# Patient Record
Sex: Male | Born: 1985 | Race: White | Hispanic: No | Marital: Married | Smoking: Former smoker
Health system: Southern US, Community
[De-identification: ages and names within clinical notes are randomized; demographics above are authoritative.]

## PROBLEM LIST (undated history)

## (undated) DIAGNOSIS — F329 Major depressive disorder, single episode, unspecified: Secondary | ICD-10-CM

## (undated) DIAGNOSIS — F419 Anxiety disorder, unspecified: Secondary | ICD-10-CM

## (undated) DIAGNOSIS — G4730 Sleep apnea, unspecified: Secondary | ICD-10-CM

## (undated) DIAGNOSIS — F32A Depression, unspecified: Secondary | ICD-10-CM

## (undated) HISTORY — PX: NOSE SURGERY: SHX723

## (undated) HISTORY — PX: HIP SURGERY: SHX245

---

## 2011-08-20 ENCOUNTER — Encounter (HOSPITAL_COMMUNITY): Payer: Self-pay | Admitting: Emergency Medicine

## 2011-08-20 ENCOUNTER — Emergency Department (HOSPITAL_COMMUNITY)
Admission: EM | Admit: 2011-08-20 | Discharge: 2011-08-20 | Disposition: A | Discharge status: Home / Self Care | Attending: Emergency Medicine | Admitting: Emergency Medicine

## 2011-08-20 DIAGNOSIS — L259 Unspecified contact dermatitis, unspecified cause: Secondary | ICD-10-CM | POA: Insufficient documentation

## 2011-08-20 MED ORDER — PREDNISONE 20 MG PO TABS
60.0000 mg | ORAL_TABLET | Freq: Once | ORAL | Status: AC
  Administered 2011-08-20: 60 mg via ORAL
  Filled 2011-08-20: qty 3

## 2011-08-20 MED ORDER — PREDNISONE 20 MG PO TABS: ORAL_TABLET | ORAL | Status: AC

## 2011-08-20 NOTE — Discharge Instructions (Signed)

## 2011-08-20 NOTE — ED Notes (Signed)
Pt. States he was clearing poisonous plants out while doing yard work. Pt states he has had redness, burning and itching to bilateral hands since then. Pt states Benadryl not effective.

## 2011-08-20 NOTE — ED Notes (Signed)
Pt alert, nad, c/o rash "poison" to hands, onset a few days ago, states recently treated for poison ivy, resp even unlabored, skin pwd

## 2011-08-20 NOTE — ED Provider Notes (Signed)
History     CSN: [id number]  Arrival date & time 08/20/11  4098   First MD Initiated Contact with Patient 08/20/11 [phone]      Chief Complaint  Patient presents with  . Rash    (Consider location/radiation/quality/duration/timing/severity/associated sxs/prior treatment) HPI History provided by patient. Rash to hands and arms. Patient has been working outside and clearing bushes and shrubs to build a fence. He believes he was exposed to poison ivy. He has rash and itching to his hands and forearms. He has history of same. Over-the-counter medications not helping. He also has some rash and itching to his nose. No facial swelling or difficulty breathing. No lip tongue or oral swelling. Moderate severity. Denies any tick exposure. Moderate in severity. No alleviating factors. History reviewed. No pertinent past medical history.  History reviewed. No pertinent past surgical history.  No family history on file.  History  Substance Use Topics  . Smoking status: Never Smoker   . Smokeless tobacco: Not on file  . Alcohol Use: No      Review of Systems  Constitutional: Negative for fever and chills.  HENT: Negative for neck pain and neck stiffness.   Eyes: Negative for pain.  Respiratory: Negative for shortness of breath.   Cardiovascular: Negative for chest pain.  Gastrointestinal: Negative for abdominal pain.  Genitourinary: Negative for dysuria.  Musculoskeletal: Negative for back pain.  Skin: Positive for rash.  Neurological: Negative for headaches.  All other systems reviewed and are negative.    Allergies  Ceclor and Nuvigil  Home Medications   Current Outpatient Rx  Name Route Sig Dispense Refill  . FEXOFENADINE HCL 180 MG PO TABS Oral Take 180 mg by mouth daily.      BP 126/92  Pulse 62  Temp(Src) 98.2 F (36.8 C) (Oral)  Resp 16  Wt 205 lb (92.987 kg)  SpO2 100%  Physical Exam  Constitutional: He is oriented to person, place, and time. He appears  well-developed and well-nourished.  HENT:  Head: Normocephalic and atraumatic.  Eyes: Conjunctivae and EOM are normal. Pupils are equal, round, and reactive to light.  Neck: Trachea normal. Neck supple. No thyromegaly present.       No stridor  Cardiovascular: Normal rate, regular rhythm, S1 normal, S2 normal and normal pulses.     No systolic murmur is present   No diastolic murmur is present  Pulses:      Radial pulses are 2+ on the right side, and 2+ on the left side.  Pulmonary/Chest: Effort normal and breath sounds normal. He has no wheezes. He has no rhonchi. He has no rales. He exhibits no tenderness.  Abdominal: Soft. Normal appearance and bowel sounds are normal. There is no tenderness. There is no CVA tenderness and negative Murphy's sign.  Musculoskeletal:       BLE:s Calves nontender, no cords or erythema, negative Homans sign  Neurological: He is alert and oriented to person, place, and time. He has normal strength. No cranial nerve deficit or sensory deficit. GCS eye subscore is 4. GCS verbal subscore is 5. GCS motor subscore is 6.  Skin: Skin is warm and dry. He is not diaphoretic.       Erythematous and urticarial rash with excoriations to hands and forearms and somewhat over the bridge of nose.  Psychiatric: His speech is normal.       Cooperative and appropriate    ED Course  Procedures (including critical care time)  Prednisone initiated  MDM  Presentation consistent with contact dermatitis with history of same and recently exposed to plants. Plan to taper prednisone for facial involvement.  palmar rash does not suggest Rocky mouth spotted fever - he is afebrile and denies tick bites. He agrees to strict return precautions for any fever or worsening condition.        Sunnie Nielsen, MD 08/20/11 [phone]

## 2012-02-13 ENCOUNTER — Emergency Department (HOSPITAL_COMMUNITY)
Admission: EM | Admit: 2012-02-13 | Discharge: 2012-02-13 | Disposition: A | Discharge status: Home / Self Care | Attending: Emergency Medicine | Admitting: Emergency Medicine

## 2012-02-13 DIAGNOSIS — R51 Headache: Secondary | ICD-10-CM

## 2012-02-13 DIAGNOSIS — J329 Chronic sinusitis, unspecified: Secondary | ICD-10-CM | POA: Insufficient documentation

## 2012-02-13 DIAGNOSIS — J029 Acute pharyngitis, unspecified: Secondary | ICD-10-CM | POA: Insufficient documentation

## 2012-02-13 DIAGNOSIS — Z79899 Other long term (current) drug therapy: Secondary | ICD-10-CM | POA: Insufficient documentation

## 2012-02-13 DIAGNOSIS — J3489 Other specified disorders of nose and nasal sinuses: Secondary | ICD-10-CM | POA: Insufficient documentation

## 2012-02-13 MED ORDER — SULFAMETHOXAZOLE-TMP DS 800-160 MG PO TABS
1.0000 | ORAL_TABLET | Freq: Once | ORAL | Status: AC
  Administered 2012-02-13: 1 via ORAL
  Filled 2012-02-13: qty 1

## 2012-02-13 MED ORDER — SULFAMETHOXAZOLE-TRIMETHOPRIM 800-160 MG PO TABS: 1.0000 | ORAL_TABLET | Freq: Two times a day (BID) | ORAL | Status: DC

## 2012-02-13 MED ORDER — OXYCODONE-ACETAMINOPHEN 5-325 MG PO TABS
1.0000 | ORAL_TABLET | Freq: Once | ORAL | Status: AC
  Administered 2012-02-13: 1 via ORAL
  Filled 2012-02-13: qty 1

## 2012-02-13 MED ORDER — OXYCODONE-ACETAMINOPHEN 5-325 MG PO TABS: 2.0000 | ORAL_TABLET | ORAL | Status: DC | PRN

## 2012-02-13 NOTE — ED Provider Notes (Signed)
History     CSN: [id number]  Arrival date & time 02/13/12  0910   First MD Initiated Contact with Patient 02/13/12 0932      Chief Complaint  Patient presents with  . Headache  . Sore Throat    (Consider location/radiation/quality/duration/timing/severity/associated sxs/prior treatment) Patient is a 26 y.o. male presenting with headaches and pharyngitis. The history is provided by the patient.  Headache  Pertinent negatives include no fever, no nausea and no vomiting.  Sore Throat Associated symptoms include headaches.   26 year old, male, presents emergency department complaining of headache, over his frontal and maxillary sinuses.  Congestion, and sore throat, for a few days.  He denies cough, nausea, or vomiting, or rash.  He did have a fever.  A few days ago, but not now.  He denies pain anywhere else.    No past medical history on file.  No past surgical history on file.  No family history on file.  History  Substance Use Topics  . Smoking status: Never Smoker   . Smokeless tobacco: Not on file  . Alcohol Use: No      Review of Systems  Constitutional: Negative for fever and chills.  HENT: Positive for congestion and sore throat.   Respiratory: Negative for cough.   Gastrointestinal: Negative for nausea and vomiting.  Skin: Negative for rash.  Neurological: Positive for headaches.  All other systems reviewed and are negative.    Allergies  Ceclor and Nuvigil  Home Medications   Current Outpatient Rx  Name  Route  Sig  Dispense  Refill  . ACETAMINOPHEN 500 MG PO TABS   Oral   Take 1,000 mg by mouth every 6 (six) hours as needed. Headache         . NYQUIL MULTI-SYMPTOM PO   Oral   Take by mouth.         . SERTRALINE HCL 100 MG PO TABS   Oral   Take 150 mg by mouth daily.         . OXYCODONE-ACETAMINOPHEN 5-325 MG PO TABS   Oral   Take 2 tablets by mouth every 4 (four) hours as needed for pain.   6 tablet   0   .  SULFAMETHOXAZOLE-TRIMETHOPRIM 800-160 MG PO TABS   Oral   Take 1 tablet by mouth 2 (two) times daily.   20 tablet   0     BP 123/80  Pulse 99  Temp 98.2 F (36.8 C) (Oral)  Resp 20  SpO2 96%  Physical Exam  Nursing note and vitals reviewed. Constitutional: He is oriented to person, place, and time. He appears well-developed and well-nourished. No distress.  HENT:  Head: Normocephalic and atraumatic.  Mouth/Throat: Oropharynx is clear and moist. No oropharyngeal exudate.  Eyes: Conjunctivae normal are normal.  Neck: Normal range of motion. Neck supple.  Pulmonary/Chest: Effort normal.  Abdominal: He exhibits no distension.  Musculoskeletal: Normal range of motion.  Lymphadenopathy:    He has no cervical adenopathy.  Neurological: He is alert and oriented to person, place, and time.  Skin: Skin is warm and dry.  Psychiatric: He has a normal mood and affect. Thought content normal.    ED Course  Procedures (including critical care time)  Labs Reviewed - No data to display No results found.   1. Sinusitis   2. Headache       MDM  Sinusitis         Cheri Guppy, MD 02/13/12 [phone]

## 2012-02-13 NOTE — ED Notes (Signed)
Pt states that for the last 2 days he has had a sore throat and "a huge headache".  Denies NVD.  Pain 8/10.

## 2012-03-01 ENCOUNTER — Encounter (HOSPITAL_COMMUNITY): Payer: Self-pay | Admitting: Emergency Medicine

## 2012-03-01 ENCOUNTER — Emergency Department (HOSPITAL_COMMUNITY)
Admission: EM | Admit: 2012-03-01 | Discharge: 2012-03-01 | Disposition: A | Discharge status: Home / Self Care | Attending: Emergency Medicine | Admitting: Emergency Medicine

## 2012-03-01 ENCOUNTER — Emergency Department (HOSPITAL_COMMUNITY)

## 2012-03-01 DIAGNOSIS — J029 Acute pharyngitis, unspecified: Secondary | ICD-10-CM | POA: Insufficient documentation

## 2012-03-01 DIAGNOSIS — R112 Nausea with vomiting, unspecified: Secondary | ICD-10-CM | POA: Insufficient documentation

## 2012-03-01 DIAGNOSIS — R059 Cough, unspecified: Secondary | ICD-10-CM | POA: Insufficient documentation

## 2012-03-01 DIAGNOSIS — F329 Major depressive disorder, single episode, unspecified: Secondary | ICD-10-CM | POA: Insufficient documentation

## 2012-03-01 DIAGNOSIS — F411 Generalized anxiety disorder: Secondary | ICD-10-CM | POA: Insufficient documentation

## 2012-03-01 DIAGNOSIS — R05 Cough: Secondary | ICD-10-CM

## 2012-03-01 DIAGNOSIS — J069 Acute upper respiratory infection, unspecified: Secondary | ICD-10-CM | POA: Insufficient documentation

## 2012-03-01 DIAGNOSIS — R1110 Vomiting, unspecified: Secondary | ICD-10-CM

## 2012-03-01 DIAGNOSIS — F3289 Other specified depressive episodes: Secondary | ICD-10-CM | POA: Insufficient documentation

## 2012-03-01 DIAGNOSIS — Z79899 Other long term (current) drug therapy: Secondary | ICD-10-CM | POA: Insufficient documentation

## 2012-03-01 HISTORY — DX: Depression, unspecified: F32.A

## 2012-03-01 HISTORY — DX: Major depressive disorder, single episode, unspecified: F32.9

## 2012-03-01 HISTORY — DX: Anxiety disorder, unspecified: F41.9

## 2012-03-01 MED ORDER — AZITHROMYCIN 250 MG PO TABS: 250.0000 mg | ORAL_TABLET | Freq: Every day | ORAL | Status: DC

## 2012-03-01 MED ORDER — HYDROCODONE-ACETAMINOPHEN 7.5-500 MG/15ML PO SOLN: 10.0000 mL | Freq: Four times a day (QID) | ORAL | Status: DC | PRN

## 2012-03-01 NOTE — ED Notes (Signed)
Pt presenting to ed with c/o cough x 2 weeks. Pt states seen here and was on antibiotics x 2 weeks and now he has swelling to the right side of his throat and right earache pain. Pt states swelling is causing him difficulty with swallowing. Pt states he has a productive cough with clear/yellowish colored phelgm

## 2012-03-01 NOTE — ED Provider Notes (Signed)
History     CSN: [id number]  Arrival date & time 03/01/12  1042   First MD Initiated Contact with Patient 03/01/12 1138      Chief Complaint  Patient presents with  . Cough    (Consider location/radiation/quality/duration/timing/severity/associated sxs/prior treatment) HPI  Darrell Bonilla is a 26 y.o. male complaining of productive cough and difficulty swallowing x2 weeks. Pt denies fever, chest pain, SOB. He reports a posttussive emesis: 2 times both nonbloody her nonbilious. He also reports a pharyngitis with difficulty swallowing.  Past Medical History  Diagnosis Date  . Anxiety   . Depression     History reviewed. No pertinent past surgical history.  No family history on file.  History  Substance Use Topics  . Smoking status: Never Smoker   . Smokeless tobacco: Not on file  . Alcohol Use: No      Review of Systems  Constitutional: Negative for fever.  Respiratory: Positive for cough. Negative for shortness of breath.   Cardiovascular: Negative for chest pain.  Gastrointestinal: Positive for nausea and vomiting. Negative for abdominal pain and diarrhea.  All other systems reviewed and are negative.    Allergies  Ceclor and Nuvigil  Home Medications   Current Outpatient Rx  Name  Route  Sig  Dispense  Refill  . MENTHOL 5 MG MT LOZG   Mouth/Throat   Use as directed 1 lozenge in the mouth or throat every 2 (two) hours. For sore throat         . NYQUIL MULTI-SYMPTOM PO   Oral   Take by mouth.         . SERTRALINE HCL 100 MG PO TABS   Oral   Take 150 mg by mouth daily.         . SULFAMETHOXAZOLE-TRIMETHOPRIM 800-160 MG PO TABS   Oral   Take 1 tablet by mouth 2 (two) times daily.   20 tablet   0     BP 135/71  Pulse 86  Temp 97.9 F (36.6 C) (Oral)  Resp 16  SpO2 96%  Physical Exam  Nursing note and vitals reviewed. Constitutional: He is oriented to person, place, and time. He appears well-developed and well-nourished. No distress.   HENT:  Head: Normocephalic.  Mouth/Throat: Oropharynx is clear and moist.       Posterior pharynx mildly injected, oral tympanic membranes normal with good light reflex  Eyes: Conjunctivae normal and EOM are normal. Pupils are equal, round, and reactive to light.  Cardiovascular: Normal rate.   Pulmonary/Chest: Effort normal and breath sounds normal. No stridor. No respiratory distress. He has no wheezes. He has no rales. He exhibits no tenderness.  Abdominal: Soft. Bowel sounds are normal. He exhibits no distension and no mass. There is no tenderness. There is no rebound and no guarding.  Musculoskeletal: Normal range of motion.  Lymphadenopathy:    He has no cervical adenopathy.  Neurological: He is alert and oriented to person, place, and time.  Psychiatric: He has a normal mood and affect.    ED Course  Procedures (including critical care time)   Labs Reviewed  RAPID STREP SCREEN  LAB REPORT - SCANNED   Dg Chest 2 View  03/01/2012  *RADIOLOGY REPORT*  Clinical Data: Mid chest pain while coughing.  CHEST - 2 VIEW  Comparison: None.  Findings: Suboptimal inspiration accounts for crowded bronchovascular markings, especially in the lung bases, and accentuates the cardiac silhouette.  Taking this into account, cardiomediastinal silhouette unremarkable and lungs clear.  No pleural effusions.  Visualized bony thorax intact.  IMPRESSION: Suboptimal inspiration.  No acute cardiopulmonary disease.   Original Report Authenticated By: Hulan Saas, M.D.      1. Cough   2. URI (upper respiratory infection)   3. Post-tussive emesis   4. Pharyngitis       MDM  Lung sounds are clear and chest x-ray shows no infiltrate. Based on constellation of symptoms, I believe Abx are warranted at this time.    Pt verbalized understanding and agrees with care plan. Outpatient follow-up and return precautions given.    New Prescriptions   AZITHROMYCIN (ZITHROMAX Z-PAK) 250 MG TABLET    Take 1  tablet (250 mg total) by mouth daily. 500mg  PO day 1, then 250mg  PO days 205   HYDROCODONE-ACETAMINOPHEN (LORTAB) 7.5-500 MG/15ML SOLUTION    Take 10 mLs by mouth every 6 (six) hours as needed for cough.          Wynetta Emery, PA-C 03/02/12 [phone]

## 2012-03-02 NOTE — ED Provider Notes (Signed)
Medical screening examination/treatment/procedure(s) were performed by non-physician practitioner and as supervising physician I was immediately available for consultation/collaboration.    Skyy Mcknight R Lorraine Terriquez, MD 03/02/12 1548 

## 2012-06-16 ENCOUNTER — Encounter (HOSPITAL_COMMUNITY): Payer: Self-pay | Admitting: Emergency Medicine

## 2012-06-16 ENCOUNTER — Emergency Department (HOSPITAL_COMMUNITY)
Admission: EM | Admit: 2012-06-16 | Discharge: 2012-06-16 | Disposition: A | Discharge status: Home / Self Care | Attending: Emergency Medicine | Admitting: Emergency Medicine

## 2012-06-16 DIAGNOSIS — Z79899 Other long term (current) drug therapy: Secondary | ICD-10-CM | POA: Insufficient documentation

## 2012-06-16 DIAGNOSIS — L02219 Cutaneous abscess of trunk, unspecified: Secondary | ICD-10-CM | POA: Insufficient documentation

## 2012-06-16 DIAGNOSIS — F3289 Other specified depressive episodes: Secondary | ICD-10-CM | POA: Insufficient documentation

## 2012-06-16 DIAGNOSIS — F411 Generalized anxiety disorder: Secondary | ICD-10-CM | POA: Insufficient documentation

## 2012-06-16 MED ORDER — HYDROCODONE-ACETAMINOPHEN 5-325 MG PO TABS: 1.0000 | ORAL_TABLET | ORAL | Status: DC | PRN

## 2012-06-16 MED ORDER — SULFAMETHOXAZOLE-TRIMETHOPRIM 800-160 MG PO TABS: 1.0000 | ORAL_TABLET | Freq: Two times a day (BID) | ORAL | Status: DC

## 2012-06-16 NOTE — ED Notes (Signed)
Pt states that he had some bumps on his skin and has had them before but had popped these 2 days ago and noticed these became red and had clear and green drainage from them. They are red and hurt with rubbing from clothes. Dried at this time.

## 2012-06-16 NOTE — ED Provider Notes (Signed)
History     CSN: [id number]  Arrival date & time 06/16/12  1040   First MD Initiated Contact with Patient 06/16/12 1051      Chief Complaint  Patient presents with  . Skin Ulcer    (Consider location/radiation/quality/duration/timing/severity/associated sxs/prior treatment) HPI  27 year old male presents to the emergency department chief complaint of boils on skin.  He has had a history of these previously on his back.  He states that they usually have resolved on their own.  The patient popped these bumps 2 days ago and throat clear and purulent drainage.  They have become red swollen and inflamed.  Patient denies any fevers, chills, nausea, vomiting, myalgias or arthralgias.   Past Medical History  Diagnosis Date  . Anxiety   . Depression     History reviewed. No pertinent past surgical history.  No family history on file.  History  Substance Use Topics  . Smoking status: Never Smoker   . Smokeless tobacco: Not on file  . Alcohol Use: No      Review of Systems  Constitutional: Negative for fever and chills.  Respiratory: Negative for cough and shortness of breath.   Cardiovascular: Negative for chest pain and palpitations.  Gastrointestinal: Negative for vomiting, abdominal pain, diarrhea and constipation.  Genitourinary: Negative for dysuria, urgency and frequency.  Musculoskeletal: Negative for myalgias and arthralgias.  Skin: Positive for wound. Negative for rash.  Neurological: Negative for headaches.    Allergies  Ceclor and Nuvigil  Home Medications   Current Outpatient Rx  Name  Route  Sig  Dispense  Refill  . buPROPion (WELLBUTRIN SR) 150 MG 12 hr tablet   Oral   Take 150 mg by mouth 2 (two) times daily.         . calcium carbonate (TUMS EX) 750 MG chewable tablet   Oral   Chew 1 tablet by mouth daily as needed for heartburn.         . citalopram (CELEXA) 40 MG tablet   Oral   Take 40 mg by mouth daily.         . diclofenac  (VOLTAREN) 75 MG EC tablet   Oral   Take 75 mg by mouth 2 (two) times daily.         . fexofenadine (ALLEGRA) 180 MG tablet   Oral   Take 180 mg by mouth daily.         Marland Kitchen OVER THE COUNTER MEDICATION   Oral   Take 1 packet by mouth daily. Mega mens vitamin pack         . HYDROcodone-acetaminophen (NORCO) 5-325 MG per tablet   Oral   Take 1-2 tablets by mouth every 4 (four) hours as needed for pain.   10 tablet   0   . sulfamethoxazole-trimethoprim (SEPTRA DS) 800-160 MG per tablet   Oral   Take 1 tablet by mouth 2 (two) times daily.   28 tablet   0     BP 152/74  Pulse 87  Temp(Src) 98.8 F (37.1 C) (Oral)  SpO2 99%  Physical Exam  Nursing note and vitals reviewed. Constitutional: He appears well-developed and well-nourished. No distress.  HENT:  Head: Normocephalic and atraumatic.  Eyes: Conjunctivae are normal. No scleral icterus.  Neck: Normal range of motion. Neck supple.  Cardiovascular: Normal rate, regular rhythm and normal heart sounds.   Pulmonary/Chest: Effort normal and breath sounds normal. No respiratory distress.  Abdominal: Soft. There is no tenderness.  Musculoskeletal: He exhibits  no edema.  Neurological: He is alert.  Skin: Skin is warm and dry. He is not diaphoretic.     2 small circular lesions with central crusting.  There is approximately 3 cm of surrounding induration of the tissue.  No central fluctuance.  Psychiatric: His behavior is normal.    ED Course  Procedures (including critical care time)  Labs Reviewed - No data to display No results found.   1. Cellulitis and abscess       MDM  12:03 PM BP 152/74  Pulse 87  Temp(Src) 98.8 F (37.1 C) (Oral)  SpO2 99% I utilized the ultrasound to look for a pocket of purulence in order to I&D the tooth no disease.  Ultrasound showed only fat stranding consistent with cellulitis.  Discharge the patient with antibiotic and supportive treatment Encouraged home warm soaks.  Mild  signs of cellulitis is surrounding skin.  Will d/c to home.  No antibiotic therapy is indicated.         Arthor Captain, PA-C 06/16/12 1204

## 2012-06-16 NOTE — Progress Notes (Signed)
Pt confirms pcp is Juanetta Gosling in [place]  EPIC updated

## 2012-06-16 NOTE — ED Provider Notes (Signed)
Medical screening examination/treatment/procedure(s) were performed by non-physician practitioner and as supervising physician I was immediately available for consultation/collaboration.  Ayodeji Keimig, MD 06/16/12 1520 

## 2012-06-17 ENCOUNTER — Emergency Department (HOSPITAL_COMMUNITY)
Admission: EM | Admit: 2012-06-17 | Discharge: 2012-06-18 | Disposition: A | Discharge status: Home / Self Care | Attending: Emergency Medicine | Admitting: Emergency Medicine

## 2012-06-17 DIAGNOSIS — F411 Generalized anxiety disorder: Secondary | ICD-10-CM | POA: Insufficient documentation

## 2012-06-17 DIAGNOSIS — F3289 Other specified depressive episodes: Secondary | ICD-10-CM | POA: Insufficient documentation

## 2012-06-17 DIAGNOSIS — Z79899 Other long term (current) drug therapy: Secondary | ICD-10-CM | POA: Insufficient documentation

## 2012-06-17 DIAGNOSIS — L02219 Cutaneous abscess of trunk, unspecified: Secondary | ICD-10-CM | POA: Insufficient documentation

## 2012-06-17 DIAGNOSIS — L539 Erythematous condition, unspecified: Secondary | ICD-10-CM | POA: Insufficient documentation

## 2012-06-17 NOTE — ED Notes (Signed)
Pt states he was seen on Monday for abscesses on his L side and near his buttocks. Pt states area to his L side is getting larger and more painful. Pt was prescribed abx and pain meds. Pt states he last took Vicodin at 1900. Pt ambulatory to exam room with steady gait. Pt has a ride home.

## 2012-06-17 NOTE — ED Provider Notes (Signed)
History    This chart was scribed for non-physician practitioner working with Gerhard Munch, MD by ED Scribe, Burman Nieves. This patient was seen in room WTR6/WTR6 and the patient's care was started at 11:15 PM.   CSN: [id number]  Arrival date & time 06/17/12  2247   First MD Initiated Contact with Patient 06/17/12 2315      Chief Complaint  Patient presents with  . Wound Check    (Consider location/radiation/quality/duration/timing/severity/associated sxs/prior treatment) Patient is a 27 y.o. male presenting with wound check. The history is provided by the patient. No language interpreter was used.  Wound Check Pertinent negatives include no chest pain, no abdominal pain, no headaches and no shortness of breath.   Darrell Bonilla is a 27 y.o. male who presents to the Emergency Department complaining of moderate constant irritation due to an abscess on his left side near his buttocks onset Monday. Pt noticed abscesses last Thursday on his back. Pt states that affected abscess is getting larger and more painful on his left side.He also has an abscess on his right lower back which has drained since Monday. Pt complains of a sharp pain sensation at the center of each abscess that radiates outward.  Pt is ambulatory in the ED currently. Pt took prescription Vicodin around 7 PM and has given pt no immediate relief. Pt denies any trouble breathing, swallowing, fever, chills, cough, nausea, vomiting, diarrhea, SOB, weakness, numbness or tingling in his extremities and any other associated symptoms. Patient was seen in ED yesterday for same complaint and had U/S done at bedside with no appreciation of a pus pocket; Pt currently takes Vactrim DS for abscesses that was provided upon d/c yesterday    Past Medical History  Diagnosis Date  . Anxiety   . Depression     No past surgical history on file.  No family history on file.  History  Substance Use Topics  . Smoking status: Never Smoker   .  Smokeless tobacco: Not on file  . Alcohol Use: No      Review of Systems  Constitutional: Negative for fever and chills.  Eyes: Negative for visual disturbance.  Respiratory: Negative for cough and shortness of breath.   Cardiovascular: Negative for chest pain.  Gastrointestinal: Negative for nausea, vomiting, abdominal pain and diarrhea.  Genitourinary: Negative for dysuria and hematuria.  Skin: Positive for color change and wound.  Neurological: Negative for headaches.  Psychiatric/Behavioral: Negative for confusion.  All other systems reviewed and are negative.    Allergies  Ceclor and Nuvigil  Home Medications   Current Outpatient Rx  Name  Route  Sig  Dispense  Refill  . buPROPion (WELLBUTRIN SR) 150 MG 12 hr tablet   Oral   Take 150 mg by mouth 2 (two) times daily.         . calcium carbonate (TUMS EX) 750 MG chewable tablet   Oral   Chew 1 tablet by mouth daily as needed for heartburn.         . citalopram (CELEXA) 40 MG tablet   Oral   Take 40 mg by mouth daily.         . diclofenac (VOLTAREN) 75 MG EC tablet   Oral   Take 75 mg by mouth 2 (two) times daily.         . fexofenadine (ALLEGRA) 180 MG tablet   Oral   Take 180 mg by mouth daily.         Marland Kitchen HYDROcodone-acetaminophen (NORCO)  5-325 MG per tablet   Oral   Take 1-2 tablets by mouth every 4 (four) hours as needed for pain.   10 tablet   0   . OVER THE COUNTER MEDICATION   Oral   Take 1 packet by mouth daily. Mega mens vitamin pack         . sulfamethoxazole-trimethoprim (SEPTRA DS) 800-160 MG per tablet   Oral   Take 1 tablet by mouth 2 (two) times daily.   28 tablet   0     BP 143/79  Pulse 98  Temp(Src) 99.3 F (37.4 C) (Oral)  Resp 18  Ht 5\' 7"  (1.702 m)  Wt 220 lb (99.791 kg)  BMI 34.45 kg/m2  SpO2 98%  Physical Exam  Nursing note and vitals reviewed. Constitutional: He is oriented to person, place, and time. He appears well-developed and well-nourished. No  distress.  HENT:  Head: Normocephalic and atraumatic.  Eyes: Conjunctivae and EOM are normal. Pupils are equal, round, and reactive to light. No scleral icterus.  Neck: Normal range of motion. Neck supple. No tracheal deviation present.  Cardiovascular: Normal rate, regular rhythm, normal heart sounds and intact distal pulses.   Pulmonary/Chest: Effort normal and breath sounds normal. No respiratory distress. He has no wheezes. He has no rales.  Abdominal: Soft. There is no tenderness. There is no rebound and no guarding.  Musculoskeletal: Normal range of motion.  Lymphadenopathy:    He has no cervical adenopathy.  Neurological: He is alert and oriented to person, place, and time.  Skin: Skin is warm and dry. No bruising, no ecchymosis and no petechiae noted. He is not diaphoretic. There is erythema. No pallor.     Abscess on left side is 5 cm in diameter with blanching erythema. Area is indurated without central fluctuance or active drainage. No linear streaking. Small area of crusting in the center of abscess. Abscess on right side is 4 cm in diameter without central fluctuance. Erythema blanching. No linear streaking.   Psychiatric: He has a normal mood and affect. His behavior is normal.    ED Course  Procedures (including critical care time) DIAGNOSTIC STUDIES: Oxygen Saturation is 98% on room air, normal by my interpretation.    COORDINATION OF CARE: 11:15 PM Discussed ED treatment with pt and pt agrees.     Labs Reviewed - No data to display No results found.  INCISION AND DRAINAGE Performed by: Antony Madura Consent: Verbal consent obtained. Risks and benefits: risks, benefits and alternatives were discussed Type: abscess  Body area: Left and right lower back  Anesthesia: local infiltration  Incision was made with a scalpel.  Local anesthetic: lidocaine 2% without epinephrine  Anesthetic total: 4 ml  Complexity: complex Blunt dissection to break up  loculations  Drainage: purulent and bloody  Drainage amount: minimal  Packing material: none  Patient tolerance: Patient tolerated the procedure well with no immediate complications.    1. Abscess of lower back      MDM  Two uncomplicated abscesses incised and drained in ED today. Patient tolerated procedure well and d/c home with PCP follow up. Patient advised to continue antibiotics and pain medications as needed; given abscess after care instruction and indications for ED return discussed. Patient states comfort and understanding with this d/c plan without any unaddressed concerns. Patient also seen by Dr. Hyacinth Meeker with whom this work up and management plan was discussed.  I personally performed the services described in this documentation, which was scribed in my presence.  The recorded information has been reviewed and is accurate.          Antony Madura, PA-C 06/20/12 0630

## 2012-06-22 NOTE — ED Provider Notes (Signed)
Multiple abscesses, L lower back and R upper buttock and on my exam has erythyema, central inderation - drained by I and D by PA Humes.  Pt is non toxic in appearance, stable for d/c.  Medical screening examination/treatment/procedure(s) were conducted as a shared visit with non-physician practitioner(s) and myself.  I personally evaluated the patient during the encounter    Vida Roller, MD 06/22/12 [phone]

## 2012-09-03 ENCOUNTER — Encounter (HOSPITAL_COMMUNITY): Payer: Self-pay | Admitting: *Deleted

## 2012-09-03 ENCOUNTER — Emergency Department (HOSPITAL_COMMUNITY)
Admission: EM | Admit: 2012-09-03 | Discharge: 2012-09-03 | Disposition: A | Discharge status: Home / Self Care | Attending: Emergency Medicine | Admitting: Emergency Medicine

## 2012-09-03 DIAGNOSIS — F3289 Other specified depressive episodes: Secondary | ICD-10-CM | POA: Insufficient documentation

## 2012-09-03 DIAGNOSIS — F411 Generalized anxiety disorder: Secondary | ICD-10-CM | POA: Insufficient documentation

## 2012-09-03 DIAGNOSIS — Z79899 Other long term (current) drug therapy: Secondary | ICD-10-CM | POA: Insufficient documentation

## 2012-09-03 DIAGNOSIS — F329 Major depressive disorder, single episode, unspecified: Secondary | ICD-10-CM | POA: Insufficient documentation

## 2012-09-03 DIAGNOSIS — R112 Nausea with vomiting, unspecified: Secondary | ICD-10-CM | POA: Insufficient documentation

## 2012-09-03 DIAGNOSIS — R197 Diarrhea, unspecified: Secondary | ICD-10-CM | POA: Insufficient documentation

## 2012-09-03 MED ORDER — ONDANSETRON 4 MG PO TBDP
4.0000 mg | ORAL_TABLET | Freq: Once | ORAL | Status: AC
  Administered 2012-09-03: 4 mg via ORAL
  Filled 2012-09-03: qty 1

## 2012-09-03 MED ORDER — LOPERAMIDE HCL 2 MG PO CAPS
4.0000 mg | ORAL_CAPSULE | Freq: Once | ORAL | Status: AC
  Administered 2012-09-03: 4 mg via ORAL
  Filled 2012-09-03: qty 2

## 2012-09-03 MED ORDER — ONDANSETRON 4 MG PO TBDP: 4.0000 mg | ORAL_TABLET | Freq: Three times a day (TID) | ORAL | Status: DC | PRN

## 2012-09-03 MED ORDER — LOPERAMIDE HCL 2 MG PO CAPS: 2.0000 mg | ORAL_CAPSULE | Freq: Four times a day (QID) | ORAL | Status: DC | PRN

## 2012-09-03 NOTE — ED Notes (Signed)
Pt in c/o diarrhea x3-4 days, states his wife is sick with similar symptoms, denies other symptoms at this time

## 2012-09-03 NOTE — ED Provider Notes (Signed)
History     CSN: [id number]  Arrival date & time 09/03/12  0245   First MD Initiated Contact with Patient 09/03/12 [phone]      Chief Complaint  Patient presents with  . Diarrhea    (Consider location/radiation/quality/duration/timing/severity/associated sxs/prior treatment) HPI Comments: he refuses treatment, stating he needs to be at his wife's side.  While she receives her treatment Patient states he watched a child, with gastroenteritis last week, and now both he and his wife have nausea, vomiting, and diarrhea.  He started 3-4, days, ago.  He is able to tolerate fluids.  He is having generalized, crampy, abdominal pain, with soft.  Frequent BMs  Patient is a 27 y.o. male presenting with diarrhea. The history is provided by the patient.  Diarrhea Quality:  Semi-solid Severity:  Mild Duration:  4 days Timing:  Intermittent Progression:  Improving Worsened by:  Nothing tried Ineffective treatments:  None tried Associated symptoms: abdominal pain   Associated symptoms: no fever and no vomiting     Past Medical History  Diagnosis Date  . Anxiety   . Depression     History reviewed. No pertinent past surgical history.  History reviewed. No pertinent family history.  History  Substance Use Topics  . Smoking status: Never Smoker   . Smokeless tobacco: Not on file  . Alcohol Use: No      Review of Systems  Constitutional: Negative for fever.  Gastrointestinal: Positive for nausea, abdominal pain and diarrhea. Negative for vomiting and abdominal distention.  Genitourinary: Negative for dysuria.  Skin: Negative for pallor.  All other systems reviewed and are negative.    Allergies  Ceclor and Nuvigil  Home Medications   Current Outpatient Rx  Name  Route  Sig  Dispense  Refill  . calcium carbonate (TUMS EX) 750 MG chewable tablet   Oral   Chew 1 tablet by mouth daily as needed for heartburn.         . cholecalciferol (VITAMIN D) 1000 UNITS tablet    Oral   Take 1,000 Units by mouth daily.         . diclofenac (VOLTAREN) 75 MG EC tablet   Oral   Take 75 mg by mouth 2 (two) times daily.         . Multiple Vitamin (MULTIVITAMIN WITH MINERALS) TABS   Oral   Take 1 tablet by mouth daily.         Marland Kitchen omega-3 acid ethyl esters (LOVAZA) 1 G capsule   Oral   Take 1 g by mouth daily.         . Vilazodone HCl 20 MG TABS   Oral   Take 20 mg by mouth daily.         Marland Kitchen loperamide (IMODIUM) 2 MG capsule   Oral   Take 1 capsule (2 mg total) by mouth 4 (four) times daily as needed for diarrhea or loose stools.   8 capsule   0   . ondansetron (ZOFRAN-ODT) 4 MG disintegrating tablet   Oral   Take 1 tablet (4 mg total) by mouth every 8 (eight) hours as needed for nausea.   20 tablet   0     BP 130/87  Pulse 77  Temp(Src) 98 F (36.7 C) (Oral)  Resp 18  SpO2 97%  Physical Exam  Nursing note and vitals reviewed. Constitutional: He appears well-developed and well-nourished.  HENT:  Head: Normocephalic.  Eyes: Pupils are equal, round, and reactive to light.  Cardiovascular:  Normal rate and regular rhythm.   Pulmonary/Chest: Effort normal and breath sounds normal.  Abdominal: Soft. Bowel sounds are normal. There is generalized tenderness.  Musculoskeletal: Normal range of motion.  Skin: Skin is warm and dry.    ED Course  Procedures (including critical care time)  Labs Reviewed - No data to display No results found.   1. Nausea vomiting and diarrhea       MDM   We'll treat with by mouth Zofran, and Imodium        Arman Filter, NP 09/03/12 0543  Arman Filter, NP 09/03/12 [phone]

## 2012-09-04 NOTE — ED Provider Notes (Signed)
Medical screening examination/treatment/procedure(s) were performed by non-physician practitioner and as supervising physician I was immediately available for consultation/collaboration.  Raeford Razor, MD 09/04/12 [phone]

## 2014-07-26 ENCOUNTER — Emergency Department (HOSPITAL_COMMUNITY): Payer: Medicare Other

## 2014-07-26 ENCOUNTER — Emergency Department (HOSPITAL_COMMUNITY)
Admission: EM | Admit: 2014-07-26 | Discharge: 2014-07-26 | Disposition: A | Discharge status: Home / Self Care | Payer: Medicare Other | Attending: Emergency Medicine | Admitting: Emergency Medicine

## 2014-07-26 ENCOUNTER — Encounter (HOSPITAL_COMMUNITY): Payer: Self-pay | Admitting: Emergency Medicine

## 2014-07-26 DIAGNOSIS — Z791 Long term (current) use of non-steroidal anti-inflammatories (NSAID): Secondary | ICD-10-CM | POA: Insufficient documentation

## 2014-07-26 DIAGNOSIS — Z8659 Personal history of other mental and behavioral disorders: Secondary | ICD-10-CM | POA: Insufficient documentation

## 2014-07-26 DIAGNOSIS — S65511A Laceration of blood vessel of left index finger, initial encounter: Secondary | ICD-10-CM | POA: Insufficient documentation

## 2014-07-26 DIAGNOSIS — S61219A Laceration without foreign body of unspecified finger without damage to nail, initial encounter: Secondary | ICD-10-CM

## 2014-07-26 DIAGNOSIS — S6992XA Unspecified injury of left wrist, hand and finger(s), initial encounter: Secondary | ICD-10-CM | POA: Diagnosis present

## 2014-07-26 DIAGNOSIS — Z23 Encounter for immunization: Secondary | ICD-10-CM | POA: Diagnosis not present

## 2014-07-26 DIAGNOSIS — Z79899 Other long term (current) drug therapy: Secondary | ICD-10-CM | POA: Diagnosis not present

## 2014-07-26 DIAGNOSIS — Y9289 Other specified places as the place of occurrence of the external cause: Secondary | ICD-10-CM | POA: Insufficient documentation

## 2014-07-26 DIAGNOSIS — W260XXA Contact with knife, initial encounter: Secondary | ICD-10-CM | POA: Diagnosis not present

## 2014-07-26 DIAGNOSIS — Y9389 Activity, other specified: Secondary | ICD-10-CM | POA: Diagnosis not present

## 2014-07-26 DIAGNOSIS — Y998 Other external cause status: Secondary | ICD-10-CM | POA: Diagnosis not present

## 2014-07-26 DIAGNOSIS — S65412A Laceration of blood vessel of left thumb, initial encounter: Secondary | ICD-10-CM | POA: Diagnosis not present

## 2014-07-26 MED ORDER — LIDOCAINE HCL 2 % IJ SOLN
10.0000 mL | Freq: Once | INTRAMUSCULAR | Status: AC
  Administered 2014-07-26: 20 mg via INTRADERMAL
  Filled 2014-07-26: qty 20

## 2014-07-26 MED ORDER — TETANUS-DIPHTH-ACELL PERTUSSIS 5-2.5-18.5 LF-MCG/0.5 IM SUSP
0.5000 mL | Freq: Once | INTRAMUSCULAR | Status: AC
  Administered 2014-07-26: 0.5 mL via INTRAMUSCULAR
  Filled 2014-07-26: qty 0.5
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
## 2014-07-26 NOTE — Discharge Instructions (Signed)

## 2014-07-26 NOTE — ED Notes (Signed)
Pt became lightheaded and c/o nausea after ditigal block was completed. Head of bed lowered, feet elevated

## 2014-07-26 NOTE — ED Notes (Signed)
Pt cut finger with large knife while cutting shrubs in yard. 2-3 cm laceration to l/index finger. Bleeding controlled. NP at bedside

## 2014-11-18 ENCOUNTER — Emergency Department (HOSPITAL_COMMUNITY)
Admission: EM | Admit: 2014-11-18 | Discharge: 2014-11-18 | Disposition: A | Discharge status: Home / Self Care | Payer: Medicare Other | Attending: Emergency Medicine | Admitting: Emergency Medicine

## 2014-11-18 ENCOUNTER — Encounter (HOSPITAL_COMMUNITY): Payer: Self-pay | Admitting: Emergency Medicine

## 2014-11-18 DIAGNOSIS — F329 Major depressive disorder, single episode, unspecified: Secondary | ICD-10-CM | POA: Insufficient documentation

## 2014-11-18 DIAGNOSIS — Z79899 Other long term (current) drug therapy: Secondary | ICD-10-CM | POA: Insufficient documentation

## 2014-11-18 DIAGNOSIS — F419 Anxiety disorder, unspecified: Secondary | ICD-10-CM | POA: Insufficient documentation

## 2014-11-18 DIAGNOSIS — Z791 Long term (current) use of non-steroidal anti-inflammatories (NSAID): Secondary | ICD-10-CM | POA: Diagnosis not present

## 2014-11-18 DIAGNOSIS — T148 Other injury of unspecified body region: Secondary | ICD-10-CM | POA: Insufficient documentation

## 2014-11-18 DIAGNOSIS — Y999 Unspecified external cause status: Secondary | ICD-10-CM | POA: Diagnosis not present

## 2014-11-18 DIAGNOSIS — Y9389 Activity, other specified: Secondary | ICD-10-CM | POA: Insufficient documentation

## 2014-11-18 DIAGNOSIS — Y92410 Unspecified street and highway as the place of occurrence of the external cause: Secondary | ICD-10-CM | POA: Insufficient documentation

## 2014-11-18 MED ORDER — LORAZEPAM 1 MG PO TABS: 1.0000 mg | ORAL_TABLET | Freq: Once | ORAL | Status: DC
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
## 2016-07-31 ENCOUNTER — Emergency Department (HOSPITAL_COMMUNITY): Payer: No Typology Code available for payment source

## 2016-07-31 ENCOUNTER — Emergency Department (HOSPITAL_COMMUNITY)
Admission: EM | Admit: 2016-07-31 | Discharge: 2016-07-31 | Disposition: A | Discharge status: Home / Self Care | Payer: No Typology Code available for payment source | Attending: Emergency Medicine | Admitting: Emergency Medicine

## 2016-07-31 ENCOUNTER — Encounter (HOSPITAL_COMMUNITY): Payer: Self-pay | Admitting: Emergency Medicine

## 2016-07-31 DIAGNOSIS — Z79899 Other long term (current) drug therapy: Secondary | ICD-10-CM | POA: Insufficient documentation

## 2016-07-31 DIAGNOSIS — S299XXA Unspecified injury of thorax, initial encounter: Secondary | ICD-10-CM | POA: Diagnosis not present

## 2016-07-31 DIAGNOSIS — R0789 Other chest pain: Secondary | ICD-10-CM

## 2016-07-31 DIAGNOSIS — Y939 Activity, unspecified: Secondary | ICD-10-CM | POA: Diagnosis not present

## 2016-07-31 DIAGNOSIS — Y92410 Unspecified street and highway as the place of occurrence of the external cause: Secondary | ICD-10-CM | POA: Insufficient documentation

## 2016-07-31 DIAGNOSIS — Z87891 Personal history of nicotine dependence: Secondary | ICD-10-CM | POA: Insufficient documentation

## 2016-07-31 DIAGNOSIS — Y999 Unspecified external cause status: Secondary | ICD-10-CM | POA: Diagnosis not present

## 2016-07-31 LAB — BASIC METABOLIC PANEL
Anion gap: 9 (ref 5–15)
BUN: 14 mg/dL (ref 6–20)
CALCIUM: 9 mg/dL (ref 8.9–10.3)
CO2: 25 mmol/L (ref 22–32)
CREATININE: 1 mg/dL (ref 0.61–1.24)
Chloride: 105 mmol/L (ref 101–111)
GFR calc non Af Amer: >60 mL/min (ref >60)
GLUCOSE: 107 mg/dL — AB (ref 65–99)
Potassium: 3.6 mmol/L (ref 3.5–5.1)
Sodium: 139 mmol/L (ref 135–145)

## 2016-07-31 LAB — CBC
HCT: 43.4 % (ref 39.0–52.0)
Hemoglobin: 15.3 g/dL (ref 13.0–17.0)
MCH: 30.5 pg (ref 26.0–34.0)
MCHC: 35.3 g/dL (ref 30.0–36.0)
MCV: 86.6 fL (ref 78.0–100.0)
PLATELETS: 260 10*3/uL (ref 150–400)
RBC: 5.01 MIL/uL (ref 4.22–5.81)
RDW: 12.6 % (ref 11.5–15.5)
WBC: 8.5 10*3/uL (ref 4.0–10.5)

## 2016-07-31 LAB — I-STAT TROPONIN, ED: TROPONIN I, POC: 0 ng/mL (ref 0.00–0.08)

## 2016-07-31 NOTE — Discharge Instructions (Signed)
Take 4 over the counter ibuprofen tablets 3 times a day or 2 over-the-counter naproxen tablets twice a day for pain. Also take tylenol 1000mg(2 extra strength) four times a day.    

## 2016-07-31 NOTE — ED Notes (Signed)
Pt stable, understands discharge instructions, and reasons for return.   
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
## 2017-07-20 ENCOUNTER — Emergency Department (HOSPITAL_COMMUNITY): Admission: EM | Admit: 2017-07-20 | Discharge: 2017-07-20 | Discharge status: Against Medical Advice

## 2017-09-21 ENCOUNTER — Encounter (HOSPITAL_COMMUNITY): Payer: Self-pay | Admitting: Emergency Medicine

## 2017-09-21 ENCOUNTER — Emergency Department (HOSPITAL_COMMUNITY)

## 2017-09-21 ENCOUNTER — Emergency Department (HOSPITAL_COMMUNITY)
Admission: EM | Admit: 2017-09-21 | Discharge: 2017-09-21 | Disposition: A | Discharge status: Home / Self Care | Attending: Physician Assistant | Admitting: Physician Assistant

## 2017-09-21 ENCOUNTER — Other Ambulatory Visit: Payer: Self-pay

## 2017-09-21 DIAGNOSIS — M70842 Other soft tissue disorders related to use, overuse and pressure, left hand: Secondary | ICD-10-CM | POA: Diagnosis not present

## 2017-09-21 DIAGNOSIS — Y33XXXA Other specified events, undetermined intent, initial encounter: Secondary | ICD-10-CM | POA: Diagnosis not present

## 2017-09-21 DIAGNOSIS — M25532 Pain in left wrist: Secondary | ICD-10-CM

## 2017-09-21 DIAGNOSIS — S63502A Unspecified sprain of left wrist, initial encounter: Secondary | ICD-10-CM | POA: Insufficient documentation

## 2017-09-21 DIAGNOSIS — Y9289 Other specified places as the place of occurrence of the external cause: Secondary | ICD-10-CM | POA: Diagnosis not present

## 2017-09-21 DIAGNOSIS — Z87891 Personal history of nicotine dependence: Secondary | ICD-10-CM | POA: Diagnosis not present

## 2017-09-21 DIAGNOSIS — Y9389 Activity, other specified: Secondary | ICD-10-CM | POA: Diagnosis not present

## 2017-09-21 DIAGNOSIS — Y990 Civilian activity done for income or pay: Secondary | ICD-10-CM | POA: Diagnosis not present

## 2017-09-21 DIAGNOSIS — Z79899 Other long term (current) drug therapy: Secondary | ICD-10-CM | POA: Diagnosis not present

## 2017-09-21 DIAGNOSIS — S6992XA Unspecified injury of left wrist, hand and finger(s), initial encounter: Secondary | ICD-10-CM | POA: Diagnosis present

## 2017-09-21 DIAGNOSIS — M779 Enthesopathy, unspecified: Secondary | ICD-10-CM

## 2017-09-21 MED ORDER — TRAMADOL HCL 50 MG PO TABS: 50.0000 mg | ORAL_TABLET | Freq: Four times a day (QID) | ORAL | 0 refills | Status: DC | PRN

## 2017-09-21 MED ORDER — IBUPROFEN 800 MG PO TABS: 800.0000 mg | ORAL_TABLET | Freq: Three times a day (TID) | ORAL | 0 refills | Status: DC | PRN

## 2017-09-21 NOTE — ED Notes (Signed)
Patient transported to X-ray 
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
## 2017-09-21 NOTE — ED Triage Notes (Signed)
Pt c/o left wrist pain and swelling that started today.

## 2017-09-21 NOTE — ED Notes (Signed)
Pt came to front desk asking for work note. Thayer OhmChris lawyer notified and instructed to write pt out of work until Wednesday. Pt given work note.

## 2017-09-21 NOTE — Progress Notes (Signed)
Orthopedic Tech Progress Note Patient Details:  Darrell LitesGregory Bonilla Mar 25, 1986 [id number]  Ortho Devices Type of Ortho Device: Velcro wrist forearm splint Ortho Device/Splint Location: LUE Ortho Device/Splint Interventions: Ordered, Application   Post Interventions Patient Tolerated: Well Instructions Provided: Care of device   Jennye MoccasinHughes, Moshe Wenger Craig 09/21/2017, 8:45 PM

## 2017-09-21 NOTE — Discharge Instructions (Signed)
Return here as needed. Follow up with the hand surgeon provided.  °

## 2017-11-22 ENCOUNTER — Emergency Department (HOSPITAL_COMMUNITY)

## 2017-11-22 ENCOUNTER — Encounter (HOSPITAL_COMMUNITY): Payer: Self-pay

## 2017-11-22 ENCOUNTER — Emergency Department (HOSPITAL_COMMUNITY)
Admission: EM | Admit: 2017-11-22 | Discharge: 2017-11-22 | Disposition: A | Discharge status: Home / Self Care | Attending: Emergency Medicine | Admitting: Emergency Medicine

## 2017-11-22 DIAGNOSIS — S39012A Strain of muscle, fascia and tendon of lower back, initial encounter: Secondary | ICD-10-CM | POA: Diagnosis not present

## 2017-11-22 DIAGNOSIS — Z79899 Other long term (current) drug therapy: Secondary | ICD-10-CM | POA: Insufficient documentation

## 2017-11-22 DIAGNOSIS — Z87891 Personal history of nicotine dependence: Secondary | ICD-10-CM | POA: Diagnosis not present

## 2017-11-22 DIAGNOSIS — Y929 Unspecified place or not applicable: Secondary | ICD-10-CM | POA: Diagnosis not present

## 2017-11-22 DIAGNOSIS — M545 Low back pain: Secondary | ICD-10-CM

## 2017-11-22 DIAGNOSIS — Y999 Unspecified external cause status: Secondary | ICD-10-CM | POA: Diagnosis not present

## 2017-11-22 DIAGNOSIS — Y939 Activity, unspecified: Secondary | ICD-10-CM | POA: Diagnosis not present

## 2017-11-22 DIAGNOSIS — S3992XA Unspecified injury of lower back, initial encounter: Secondary | ICD-10-CM | POA: Diagnosis present

## 2017-11-22 DIAGNOSIS — T148XXA Other injury of unspecified body region, initial encounter: Secondary | ICD-10-CM

## 2017-11-22 MED ORDER — METHOCARBAMOL 500 MG PO TABS: 500.0000 mg | ORAL_TABLET | Freq: Two times a day (BID) | ORAL | 0 refills | Status: DC

## 2017-11-22 MED ORDER — ACETAMINOPHEN 500 MG PO TABS
1000.0000 mg | ORAL_TABLET | Freq: Once | ORAL | Status: AC
  Administered 2017-11-22: 1000 mg via ORAL
  Filled 2017-11-22: qty 2

## 2017-11-22 NOTE — Discharge Instructions (Signed)

## 2017-11-22 NOTE — ED Notes (Signed)
Declined W/C at D/C and was escorted to lobby by RN. 
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
## 2017-12-17 ENCOUNTER — Encounter (HOSPITAL_COMMUNITY): Payer: Self-pay | Admitting: Emergency Medicine

## 2017-12-17 ENCOUNTER — Emergency Department (HOSPITAL_COMMUNITY)

## 2017-12-17 ENCOUNTER — Emergency Department (HOSPITAL_COMMUNITY)
Admission: EM | Admit: 2017-12-17 | Discharge: 2017-12-17 | Disposition: A | Discharge status: Home / Self Care | Attending: Emergency Medicine | Admitting: Emergency Medicine

## 2017-12-17 DIAGNOSIS — Z87891 Personal history of nicotine dependence: Secondary | ICD-10-CM | POA: Insufficient documentation

## 2017-12-17 DIAGNOSIS — Z79899 Other long term (current) drug therapy: Secondary | ICD-10-CM | POA: Diagnosis not present

## 2017-12-17 DIAGNOSIS — M722 Plantar fascial fibromatosis: Secondary | ICD-10-CM

## 2017-12-17 DIAGNOSIS — M79672 Pain in left foot: Secondary | ICD-10-CM | POA: Diagnosis present

## 2017-12-17 NOTE — ED Triage Notes (Signed)
Pt arrives to the ED with left foot pain for the last 2 months with no recent injury. Pt states this pain comes and goes. Denies and redness or swelling.

## 2017-12-17 NOTE — Discharge Instructions (Addendum)
Your x-ray was normal today. There were no broken bones or dislocations. Your ligaments also feel very stabilized on my physical exam.  As we discussed, your symptoms are consistent with plantar fasciitis. I have provided you with some information on that. You may use Tylenol and/or Ibuprofen/for pain relief and swelling. You may also use cold compresses for additional relief for 15-20 minutes. You may follow-up with your PCP if you continue to have issues for more than 4-6 weeks. I have also listed info for a podiatry (foot specialty) group that you can follow-up with.

## 2017-12-17 NOTE — ED Provider Notes (Signed)
[facility]  CSN: [id number] Arrival date & time: 12/17/17  [id number]  History   Chief Complaint Chief Complaint  Patient presents with  . Foot Pain   HPI Darrell NunneryGregory Bonilla is a 32 y.o. male with no significant medical history who presents with left foot pain. Pain described as aching and throbbing. Onset of the symptoms was 6+ months ago. Precipitating event: none known. Denies trauma, injury or fall. Current symptoms include: ability to bear weight, but with some pain and pain on the plantar aspect. Pain is worse first thing in the morning and improves as the day progresses. Symptoms have been intermittent with no specific trigger or precipitating factors. Patient has had no prior foot problems. Evaluation to date: none. Treatment to date: none. Denies fever, erythema, warmth, color or temperature changes, swelling, paresthesias, weakness, foot drop, other arthralgias.     Past Medical History:  Diagnosis Date  . Anxiety   . Depression     There are no active problems to display for this patient.   Past Surgical History:  Procedure Laterality Date  . HIP SURGERY    . NOSE SURGERY      Home Medications    Prior to Admission medications   Medication Sig Start Date End Date Taking? Authorizing Provider  calcium carbonate (TUMS EX) 750 MG chewable tablet Chew 1 tablet by mouth daily as needed for heartburn.    [provider]  cholecalciferol (VITAMIN D) 1000 UNITS tablet Take 1,000 Units by mouth daily.    [provider]  diclofenac (VOLTAREN) 75 MG EC tablet Take 75 mg by mouth 2 (two) times daily.    [provider]  ibuprofen (ADVIL,MOTRIN) 800 MG tablet Take 1 tablet (800 mg total) by mouth every 8 (eight) hours as needed. 09/21/17   Lawyer, Cristal Deerhristopher, PA-C  loperamide (IMODIUM) 2 MG capsule Take 1 capsule (2 mg total) by mouth 4 (four) times daily as needed for diarrhea or loose stools. 09/03/12    Earley FavorSchulz, Gail, NP  LORazepam (ATIVAN) 1 MG tablet Take 1 tablet (1 mg total) by mouth once. 11/18/14   Ward, Layla MawKristen N, DO  methocarbamol (ROBAXIN) 500 MG tablet Take 1 tablet (500 mg total) by mouth 2 (two) times daily. 11/22/17   Maxwell CaulLayden, Lindsey A, PA-C  Multiple Vitamin (MULTIVITAMIN WITH MINERALS) TABS Take 1 tablet by mouth daily.    [provider]  omega-3 acid ethyl esters (LOVAZA) 1 G capsule Take 1 g by mouth daily.    [provider]  ondansetron (ZOFRAN-ODT) 4 MG disintegrating tablet Take 1 tablet (4 mg total) by mouth every 8 (eight) hours as needed for nausea. 09/03/12   Earley FavorSchulz, Gail, NP  traMADol (ULTRAM) 50 MG tablet Take 1 tablet (50 mg total) by mouth every 6 (six) hours as needed for severe pain. 09/21/17   Lawyer, Cristal Deerhristopher, PA-C  Vilazodone HCl 20 MG TABS Take 20 mg by mouth daily.    [provider]    Family History No family history on file.  Social History Social History   Tobacco Use  . Smoking status: Former Smoker    Types: Pipe  . Smokeless tobacco: Former Engineer, waterUser  Substance Use Topics  . Alcohol use: No  . Drug use: No     Allergies   Ceclor [cefaclor] and Nuvigil [armodafinil]   Review of Systems Review of Systems  Constitutional: Negative for chills and fever.  Genitourinary: Negative.   Musculoskeletal: Positive for arthralgias. Negative for  gait problem, myalgias and neck pain.  Skin: Negative for color change, pallor, rash and wound.  Neurological: Negative for weakness and numbness.   Physical Exam Updated Vital Signs BP 122/89 (BP Location: Right Arm)   Pulse 93   Temp 98.8 F (37.1 C) (Oral)   Resp 14   SpO2 97%   Physical Exam  Constitutional: Vital signs are normal. He appears well-developed and well-nourished. No distress.  Cardiovascular:  Pulses:      Dorsalis pedis pulses are 2+ on the right side, and 2+ on the left side.       Posterior tibial pulses are 2+ on the right side, and 2+ on the left side.   Musculoskeletal:       Right foot: There is normal range of motion and no deformity.       Left foot: There is normal range of motion and no deformity.  Able to ambulate and bear weight without issue. Endorses pain along plantar aspect of left foot with ambulation. Full ROM of toes, foot and ankles bilaterally with 5/5 strength. No bony tenderness, swelling or ligamental laxity.  Feet:  Right Foot:  Skin Integrity: Negative for erythema or warmth.  Left Foot:  Skin Integrity: Negative for erythema or warmth.  Neurological: He has normal strength. He displays no atrophy. No sensory deficit. He exhibits normal muscle tone. Gait normal.  Reflex Scores:      Achilles reflexes are 2+ on the right side and 2+ on the left side. Skin: Skin is warm and intact. Capillary refill takes less than 2 seconds. No bruising and no rash noted. No erythema.  Nursing note and vitals reviewed.  ED Treatments / Results  Labs (all labs ordered are listed, but only abnormal results are displayed) Labs Reviewed - No data to display  EKG None  Radiology Dg Foot Complete Left  Result Date: 12/17/2017 CLINICAL DATA:  Left foot pain for 6 months. EXAM: LEFT FOOT - COMPLETE 3+ VIEW COMPARISON:  No recent prior. FINDINGS: There is no evidence of fracture or dislocation. There is no evidence of arthropathy or other focal bone abnormality. Soft tissues are unremarkable. IMPRESSION: No acute bony or joint abnormality identified. No evidence of fracture or dislocation. Electronically Signed   By: Maisie Fus  Register   On: 12/17/2017 10:42    Procedures Procedures (including critical care time)  Medications Ordered in ED Medications - No data to display   Initial Impression / Assessment and Plan / ED Course  Triage vital signs and the nursing notes have been reviewed.  Pertinent labs & imaging results that were available during care of the patient were reviewed and considered in medical decision making (see chart  for details).   Patient is in no distress and well appearing. History most consistent with plantar fasciitis. Patient has full sensation in left foot ankle. He also has full active and passive ROM. No deformities, decreased muscle tone or other abnormalities visualized. Neurovascular function is intact. Physical exam and x-rays are reassuring. There are no other physical exam findings or s/s that suggest an underlying infectious or rheumatologic process that warrant further evaluation or intervention today.   Final Clinical Impressions(s) / ED Diagnoses  1. Left Plantar Fasciitis. Education provided on OTC and supportive treatment for pain relief and inflammation. Advised to follow-up with his PCP for further evaluation. Given information on podiatry group he can follow-up with as well.  Dispo: Home. After thorough clinical evaluation, this patient is determined to be medically stable and  can be safely discharged with the previously mentioned treatment and/or outpatient follow-up/referral(s). At this time, there are no other apparent medical conditions that require further screening, evaluation or treatment.   Final diagnoses:  Plantar fasciitis    ED Discharge Orders    None        Reva Bores 12/17/17 1112    Arby Barrette, MD 12/19/17 [phone]

## 2018-10-10 ENCOUNTER — Encounter (HOSPITAL_COMMUNITY): Payer: Self-pay | Admitting: Emergency Medicine

## 2018-10-10 ENCOUNTER — Emergency Department (HOSPITAL_COMMUNITY)
Admission: EM | Admit: 2018-10-10 | Discharge: 2018-10-10 | Disposition: A | Discharge status: Home / Self Care | Attending: Emergency Medicine | Admitting: Emergency Medicine

## 2018-10-10 ENCOUNTER — Other Ambulatory Visit: Payer: Self-pay

## 2018-10-10 DIAGNOSIS — Z23 Encounter for immunization: Secondary | ICD-10-CM | POA: Insufficient documentation

## 2018-10-10 DIAGNOSIS — W2210XA Striking against or struck by unspecified automobile airbag, initial encounter: Secondary | ICD-10-CM | POA: Diagnosis not present

## 2018-10-10 DIAGNOSIS — S0512XA Contusion of eyeball and orbital tissues, left eye, initial encounter: Secondary | ICD-10-CM | POA: Diagnosis not present

## 2018-10-10 DIAGNOSIS — Z87891 Personal history of nicotine dependence: Secondary | ICD-10-CM | POA: Insufficient documentation

## 2018-10-10 DIAGNOSIS — Y939 Activity, unspecified: Secondary | ICD-10-CM | POA: Insufficient documentation

## 2018-10-10 DIAGNOSIS — T148XXA Other injury of unspecified body region, initial encounter: Secondary | ICD-10-CM

## 2018-10-10 DIAGNOSIS — Y929 Unspecified place or not applicable: Secondary | ICD-10-CM | POA: Diagnosis not present

## 2018-10-10 DIAGNOSIS — Z79899 Other long term (current) drug therapy: Secondary | ICD-10-CM | POA: Diagnosis not present

## 2018-10-10 DIAGNOSIS — Y999 Unspecified external cause status: Secondary | ICD-10-CM | POA: Insufficient documentation

## 2018-10-10 DIAGNOSIS — S0081XA Abrasion of other part of head, initial encounter: Secondary | ICD-10-CM | POA: Diagnosis present

## 2018-10-10 MED ORDER — METHOCARBAMOL 500 MG PO TABS: 500.0000 mg | ORAL_TABLET | Freq: Two times a day (BID) | ORAL | 0 refills | Status: DC

## 2018-10-10 MED ORDER — TETANUS-DIPHTH-ACELL PERTUSSIS 5-2.5-18.5 LF-MCG/0.5 IM SUSP
0.5000 mL | Freq: Once | INTRAMUSCULAR | Status: AC
Start: 2018-10-10 — End: 2018-10-10
  Administered 2018-10-10: 0.5 mL via INTRAMUSCULAR
  Filled 2018-10-10: qty 0.5
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
## 2018-10-10 NOTE — ED Notes (Signed)
Dressing applied to wound.  
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
## 2020-02-07 IMAGING — DX DG WRIST COMPLETE 3+V*L*
4 series · 4 of 4 positions shown · non-contrast
Comparison: Left hand dated 07/26/2014.

CLINICAL DATA: Left wrist pain for the past 3 hours. No known
injury.

EXAM:
LEFT WRIST - COMPLETE 3+ VIEW

[wrist pa]
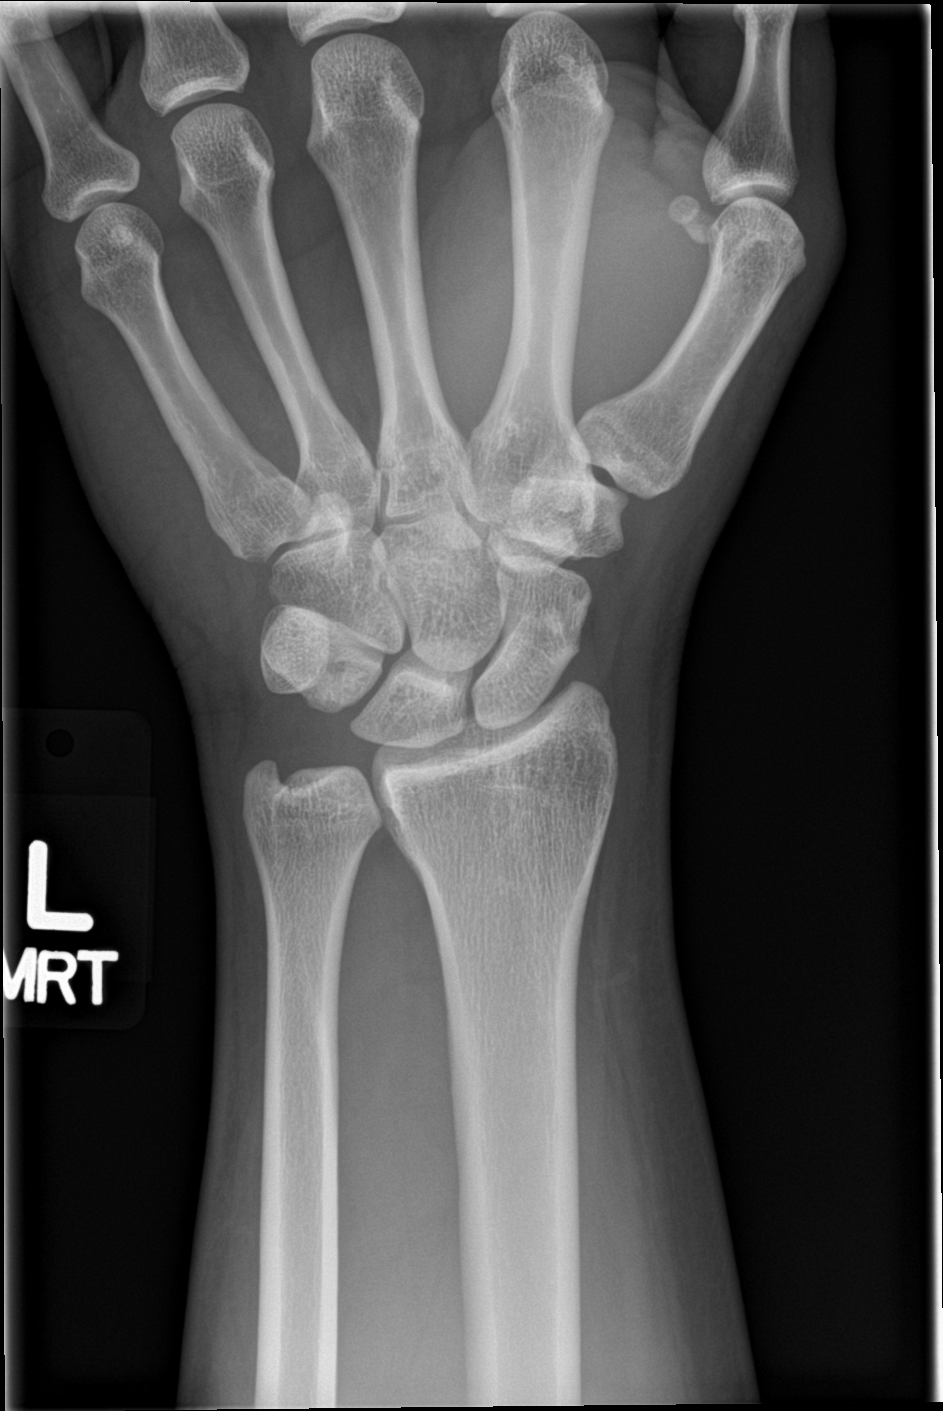

[wrist obl]
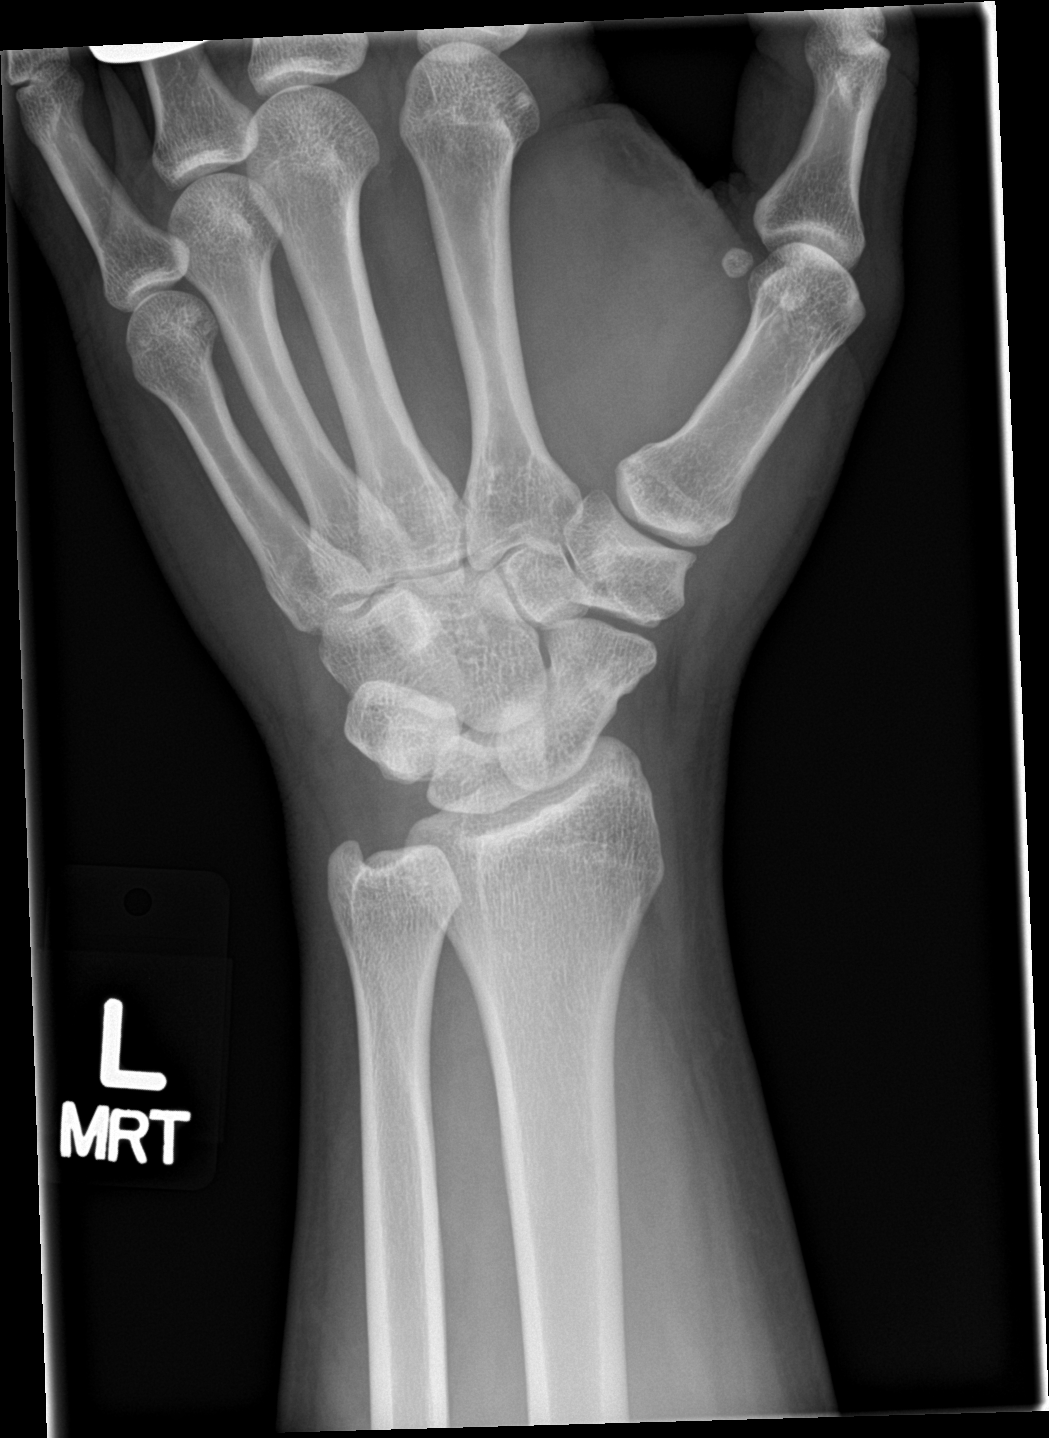

[wrist lat]
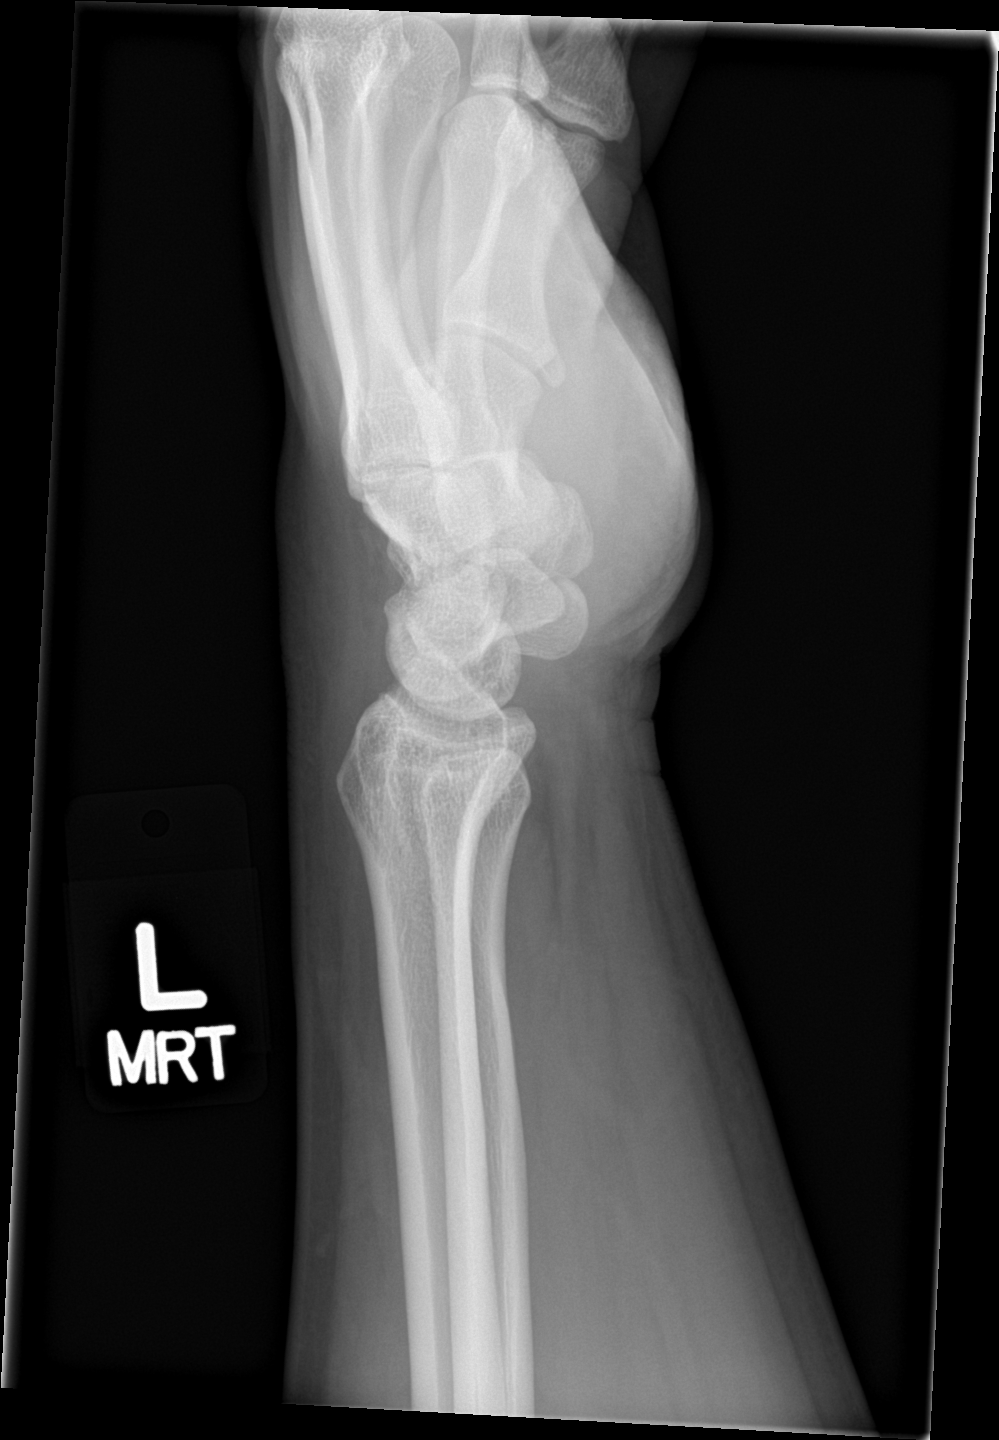

[wrist navicular]
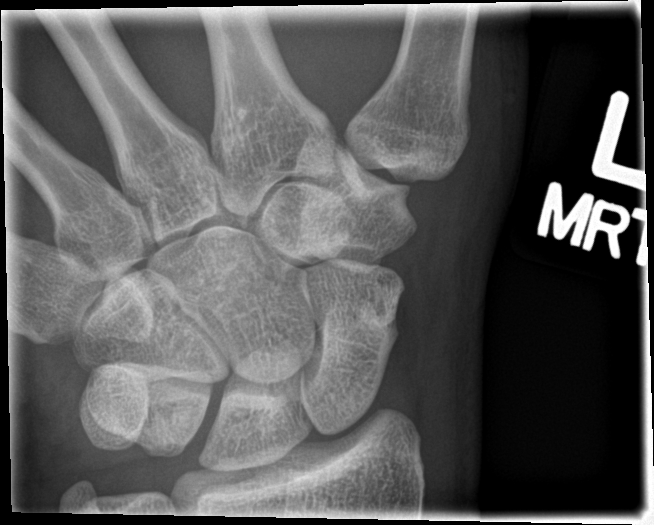

[4 of 4 positions shown; findings below may reference images not displayed]

FINDINGS: There is no evidence of fracture or dislocation. There is no
evidence of arthropathy or other focal bone abnormality. Soft
tissues are unremarkable.
IMPRESSION: Normal examination.

## 2020-06-08 IMAGING — DX DG FOOT COMPLETE 3+V*L*
3 series · 3 of 3 positions shown · non-contrast
Comparison: No recent prior.

CLINICAL DATA: Left foot pain for 6 months.

EXAM:
LEFT FOOT - COMPLETE 3+ VIEW

[foot ap]
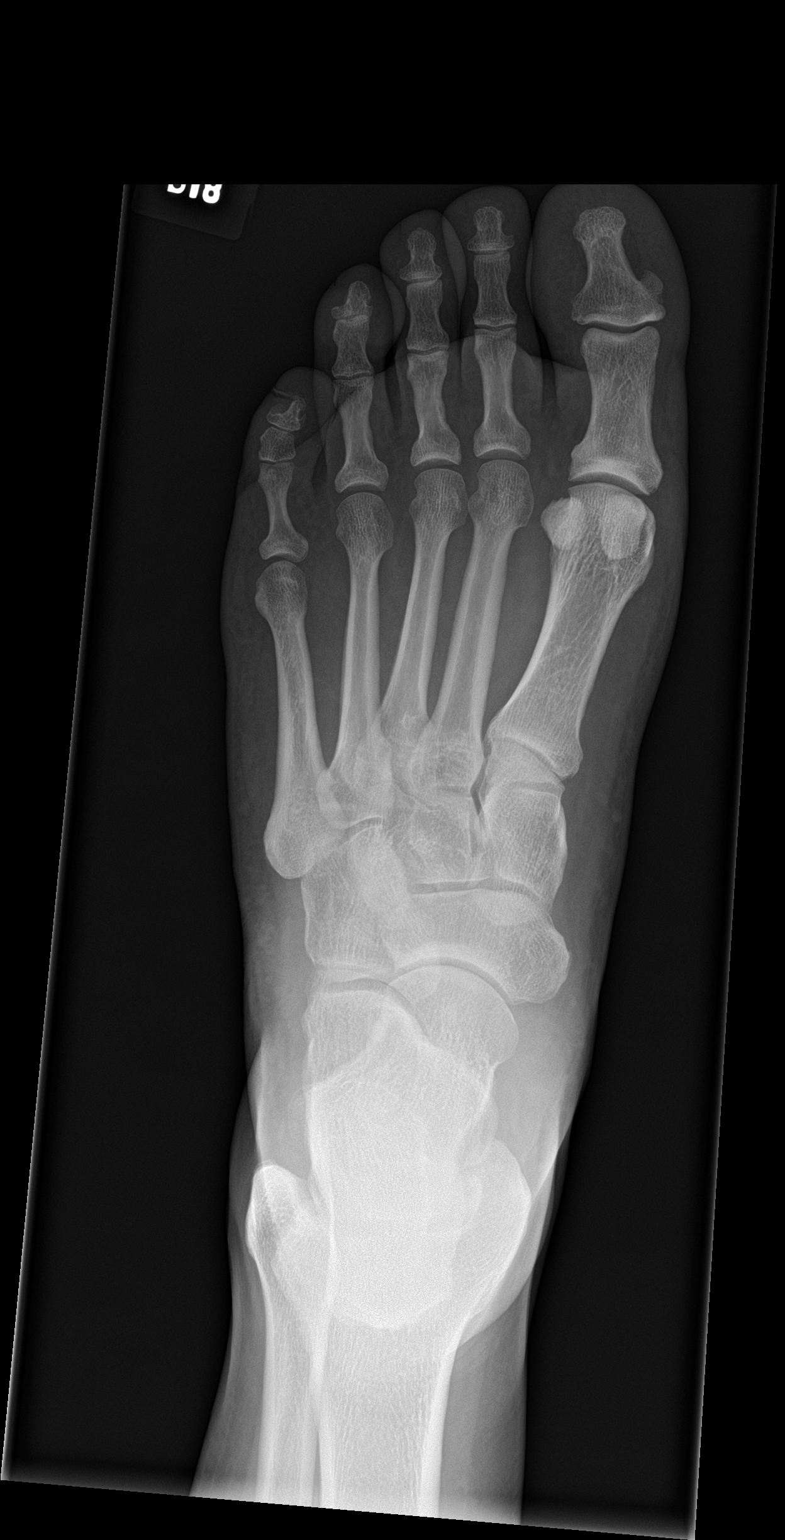

[foot obl]
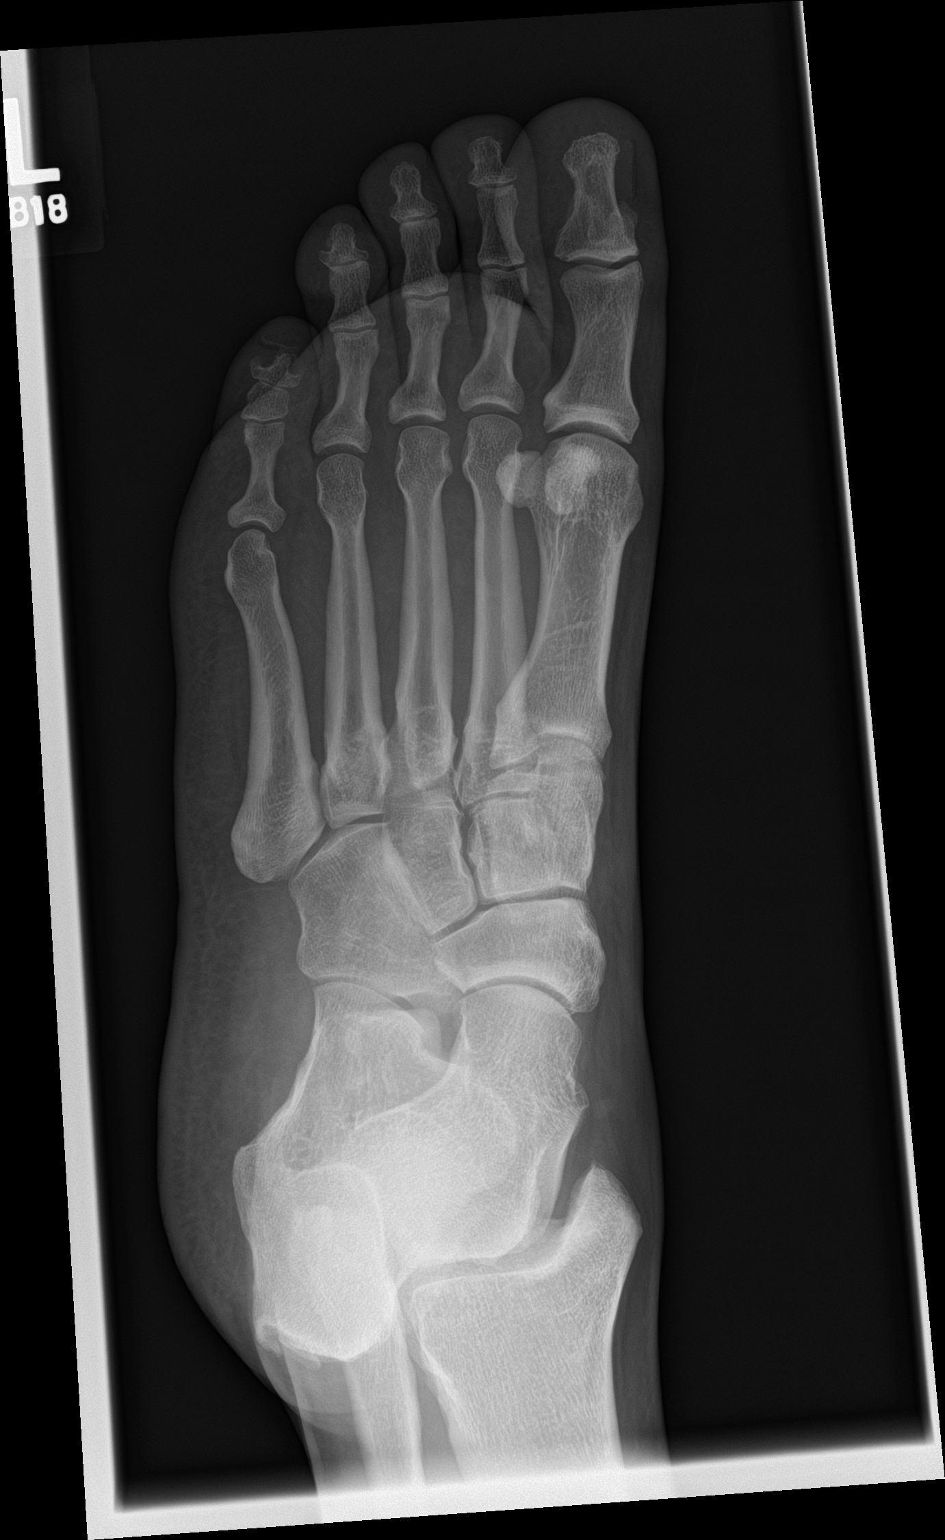

[foot lat]
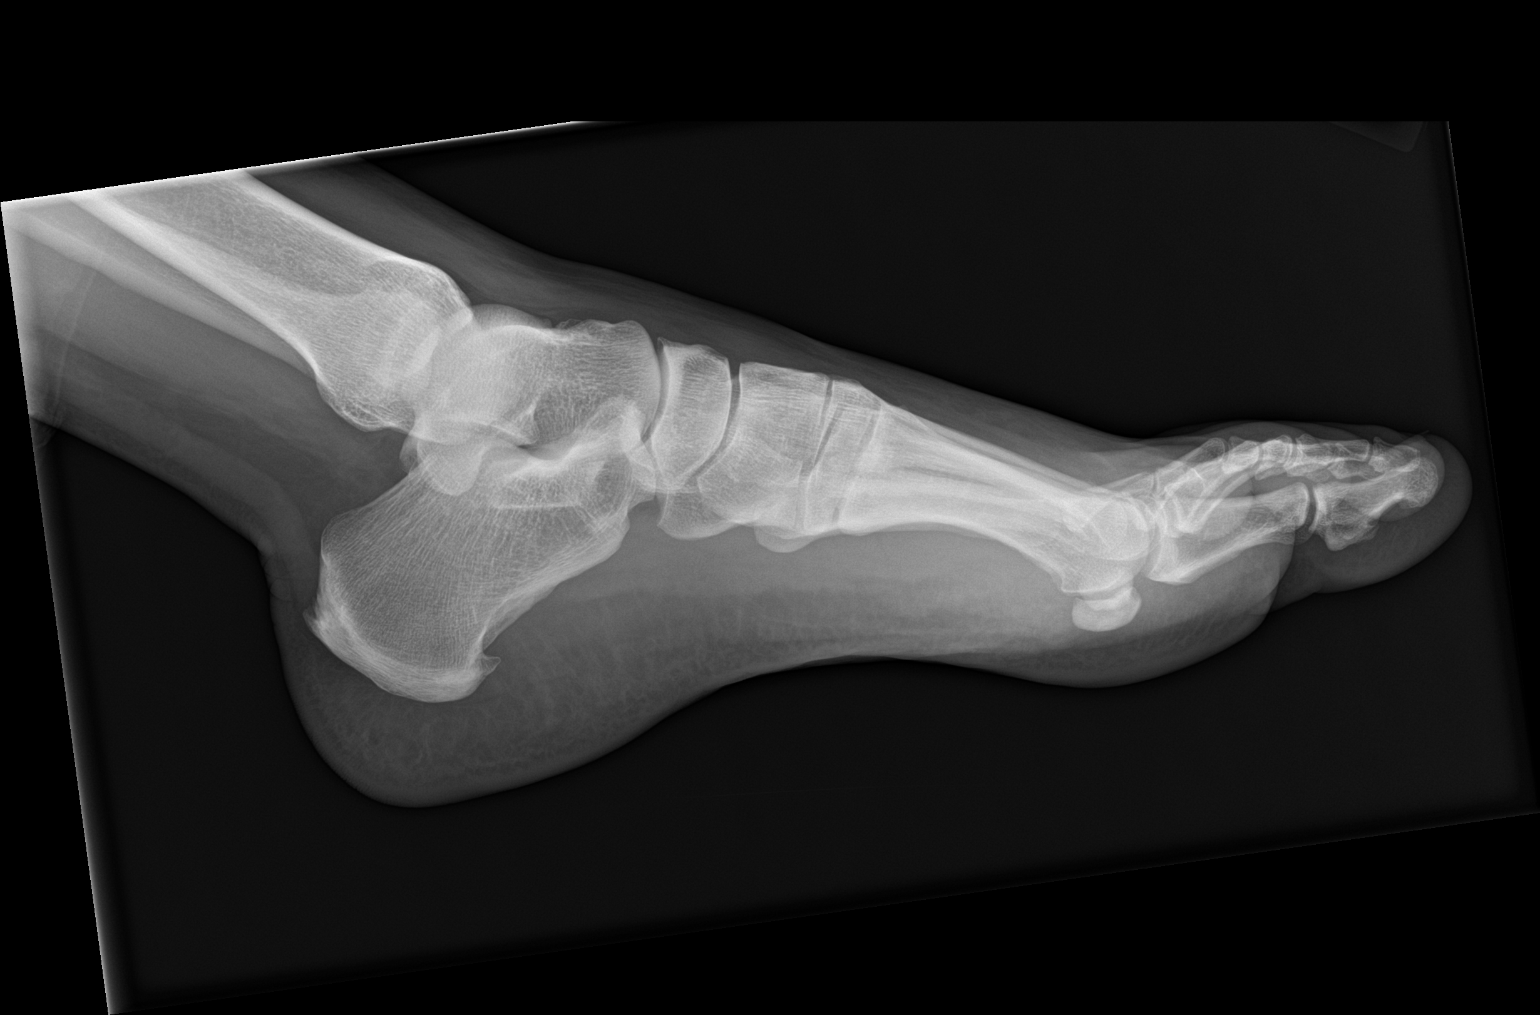

[3 of 3 positions shown; findings below may reference images not displayed]

FINDINGS: There is no evidence of fracture or dislocation. There is no
evidence of arthropathy or other focal bone abnormality. Soft
tissues are unremarkable.
IMPRESSION: No acute bony or joint abnormality identified. No evidence of
fracture or dislocation.

## 2021-10-13 ENCOUNTER — Ambulatory Visit (INDEPENDENT_AMBULATORY_CARE_PROVIDER_SITE_OTHER): Admitting: Mental Health

## 2021-10-13 DIAGNOSIS — F4310 Post-traumatic stress disorder, unspecified: Secondary | ICD-10-CM

## 2021-10-13 DIAGNOSIS — F902 Attention-deficit hyperactivity disorder, combined type: Secondary | ICD-10-CM | POA: Diagnosis not present
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
## 2021-10-27 ENCOUNTER — Ambulatory Visit (INDEPENDENT_AMBULATORY_CARE_PROVIDER_SITE_OTHER): Admitting: Mental Health

## 2021-10-27 DIAGNOSIS — F0630 Mood disorder due to known physiological condition, unspecified: Secondary | ICD-10-CM

## 2021-10-27 DIAGNOSIS — F902 Attention-deficit hyperactivity disorder, combined type: Secondary | ICD-10-CM | POA: Diagnosis not present
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
## 2021-11-24 ENCOUNTER — Ambulatory Visit: Admitting: Mental Health

## 2021-12-07 ENCOUNTER — Ambulatory Visit (INDEPENDENT_AMBULATORY_CARE_PROVIDER_SITE_OTHER): Admitting: Mental Health

## 2021-12-07 DIAGNOSIS — F0630 Mood disorder due to known physiological condition, unspecified: Secondary | ICD-10-CM | POA: Diagnosis not present

## 2021-12-07 NOTE — Progress Notes (Signed)
[facility] Note Name: Darrell Bonilla Date: 12/07/2021 MRN: [id number] DOB: Oct 27, 1985 PCP: Patient, No Pcp Per  Time spent: 50 minutes  Treatment;   Ind. therapy  Mental Status Exam:    Appearance:    Casual     Behavior:   Appropriate  Motor:   WNL  Speech/Language:    Clear and Coherent  Affect:   Full range   Mood:   Euthymic  Thought process:   Logical, linear, goal directed  Thought content:     WNL  Sensory/Perceptual disturbances:     none  Orientation:   x4  Attention:   Good  Concentration:   Good  Memory:   Intact  Fund of knowledge:    Consistent with age and development  Insight:     Good  Judgment:    Good  Impulse Control:   Good     Reported Symptoms:  Problems with focus and attention, distractible, mood swings, negative self talk, blackouts  Risk Assessment: Danger to Self:  No Self-injurious Behavior: No Danger to Others: No Duty to Warn:no Physical Aggression / Violence:No  Access to Firearms a concern: No  Gang Involvement:No  Patient / guardian was educated about steps to take if suicide or homicide risk level increases between visits: yes While future psychiatric events cannot be accurately predicted, the patient does not currently require acute inpatient psychiatric care and does not currently meet [facility] involuntary commitment criteria.    Medications: Current Outpatient Medications  Medication Sig Dispense Refill   calcium carbonate (TUMS EX) 750 MG chewable tablet Chew 1 tablet by mouth daily as needed for heartburn.     cholecalciferol (VITAMIN D) 1000 UNITS tablet Take 1,000 Units by mouth daily.     diclofenac (VOLTAREN) 75 MG EC tablet Take 75 mg by mouth 2 (two) times daily.     ibuprofen (ADVIL,MOTRIN) 800 MG tablet Take 1 tablet (800 mg total) by mouth every 8 (eight) hours as needed. 21 tablet 0   loperamide (IMODIUM) 2 MG capsule Take 1 capsule (2 mg total) by mouth 4 (four) times daily as needed for diarrhea or  loose stools. 8 capsule 0   LORazepam (ATIVAN) 1 MG tablet Take 1 tablet (1 mg total) by mouth once. 2 tablet 0   methocarbamol (ROBAXIN) 500 MG tablet Take 1 tablet (500 mg total) by mouth 2 (two) times daily. 8 tablet 0   Multiple Vitamin (MULTIVITAMIN WITH MINERALS) TABS Take 1 tablet by mouth daily.     omega-3 acid ethyl esters (LOVAZA) 1 G capsule Take 1 g by mouth daily.     ondansetron (ZOFRAN-ODT) 4 MG disintegrating tablet Take 1 tablet (4 mg total) by mouth every 8 (eight) hours as needed for nausea. 20 tablet 0   traMADol (ULTRAM) 50 MG tablet Take 1 tablet (50 mg total) by mouth every 6 (six) hours as needed for severe pain. 15 tablet 0   Vilazodone HCl 20 MG TABS Take 20 mg by mouth daily.     No current facility-administered medications for this visit.      Subjective: Patient arrived on time for today's session.  Explored recent stressors where patient stated he had a challenging day at work.  Patient stated that he can get easily agitated at work going on to provide details.  He identified his tendencies toward being "obsessive about details at work" going on to give examples.  He identified throughout the session how this potentially relates most likely with his military background  going on to share further experiences in the military that he feels have reinforced this behavior.  He stated that his tendency to want everything to be "done a certain way" is primarily at work.  Through guided discovery and assisting him in identifying thoughts related, patient stated he plans to remind himself that his coworkers are not fellow soldiers and how he has to work on being mindful of his expectations.  He stated that he and his wife remain separated and had an argument recently.  At this point he stated that he does not feel that they will reunite and will most likely divorce.  He stated they have been separated for about 3 years. Discussed some coping skills related to being mindful of his  reactions at work, giving himself moments to consider how he wants to handle certain situations prior to going to work in an effort to be more aware of his reactions and associated thoughts.   Interventions:  motivational interviewing, supportive therapy, CBT    Diagnoses:    ICD-10-CM   1. Mood disorder in conditions classified elsewhere  F06.30        Long-Term Treatment Goals: Reduce the severity and frequency of mood swings. Reduce distressful feelings related to past childhood traumas.   Short-Term Treatment Goals: Identify both past and current emotionally unresolved life events Improve identification and management of anger triggers    Waldron Session, [facility]

## 2021-12-21 ENCOUNTER — Ambulatory Visit (INDEPENDENT_AMBULATORY_CARE_PROVIDER_SITE_OTHER): Admitting: Mental Health

## 2021-12-21 DIAGNOSIS — F0630 Mood disorder due to known physiological condition, unspecified: Secondary | ICD-10-CM
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
## 2022-01-05 ENCOUNTER — Ambulatory Visit (INDEPENDENT_AMBULATORY_CARE_PROVIDER_SITE_OTHER): Admitting: Mental Health

## 2022-01-05 DIAGNOSIS — F0630 Mood disorder due to known physiological condition, unspecified: Secondary | ICD-10-CM | POA: Diagnosis not present

## 2022-01-05 NOTE — Progress Notes (Signed)
[facility] Note Name: Reily Langhorst Date: 01/05/2022 MRN: [id number] DOB: April 08, 1985 PCP: Patient, No Pcp Per  Time spent: 54 minutes  Treatment;   Ind. therapy  Mental Status Exam:    Appearance:    Casual     Behavior:   Appropriate  Motor:   WNL  Speech/Language:    Clear and Coherent  Affect:   Full range   Mood:   Pleasant, agitated  Thought process:   Logical, linear, goal directed  Thought content:     WNL  Sensory/Perceptual disturbances:     none  Orientation:   x4  Attention:   Good  Concentration:   Good  Memory:   Intact  Fund of knowledge:    Consistent with age and development  Insight:     Good  Judgment:    Good  Impulse Control:   Good     Reported Symptoms:  Problems with focus and attention, distractible, mood swings, negative self talk, blackouts  Risk Assessment: Danger to Self:  No Self-injurious Behavior: No Danger to Others: No Duty to Warn:no Physical Aggression / Violence:No  Access to Firearms a concern: No  Gang Involvement:No  Patient / guardian was educated about steps to take if suicide or homicide risk level increases between visits: yes While future psychiatric events cannot be accurately predicted, the patient does not currently require acute inpatient psychiatric care and does not currently meet [facility] involuntary commitment criteria.    Medications: Current Outpatient Medications  Medication Sig Dispense Refill   calcium carbonate (TUMS EX) 750 MG chewable tablet Chew 1 tablet by mouth daily as needed for heartburn.     cholecalciferol (VITAMIN D) 1000 UNITS tablet Take 1,000 Units by mouth daily.     diclofenac (VOLTAREN) 75 MG EC tablet Take 75 mg by mouth 2 (two) times daily.     ibuprofen (ADVIL,MOTRIN) 800 MG tablet Take 1 tablet (800 mg total) by mouth every 8 (eight) hours as needed. 21 tablet 0   loperamide (IMODIUM) 2 MG capsule Take 1 capsule (2 mg total) by mouth 4 (four) times daily as needed for  diarrhea or loose stools. 8 capsule 0   LORazepam (ATIVAN) 1 MG tablet Take 1 tablet (1 mg total) by mouth once. 2 tablet 0   methocarbamol (ROBAXIN) 500 MG tablet Take 1 tablet (500 mg total) by mouth 2 (two) times daily. 8 tablet 0   Multiple Vitamin (MULTIVITAMIN WITH MINERALS) TABS Take 1 tablet by mouth daily.     omega-3 acid ethyl esters (LOVAZA) 1 G capsule Take 1 g by mouth daily.     ondansetron (ZOFRAN-ODT) 4 MG disintegrating tablet Take 1 tablet (4 mg total) by mouth every 8 (eight) hours as needed for nausea. 20 tablet 0   traMADol (ULTRAM) 50 MG tablet Take 1 tablet (50 mg total) by mouth every 6 (six) hours as needed for severe pain. 15 tablet 0   Vilazodone HCl 20 MG TABS Take 20 mg by mouth daily.     No current facility-administered medications for this visit.      Subjective: Patient arrived on time for today's session.  Patient shared recent events.  He shared more history related to the relationship with his wife, he shared a recent discussion as well where they continue to discuss finances and their relationship.  He stated at this point, there may be a possibility that they could reunite, that he would like this to happen as he loves his wife and his  son and wants to be a part of their lives.  He shared his routine of caring for his son after he gets off work, then returning to his apartment in the evenings.  Through guided discovery and further exploration, it was identified that patient needs more structure when not at work particularly in the evenings.  Patient has a inconsistent sleep schedule and we discussed the impact it can have on not only his health but as well as his emotional reactions.  Patient continues to state ways he wants to be able to manage his emotions in situations where he shared with family as well as when talking with his wife.  He plans to follow through with integrating more structure in his evenings to get to bed earlier and get more  rest.   Interventions:  motivational interviewing, supportive therapy, CBT    Diagnoses:    ICD-10-CM   1. Mood disorder in conditions classified elsewhere  F06.30          Long-Term Treatment Goals: Reduce the severity and frequency of mood swings. Reduce distressful feelings related to past childhood traumas.   Short-Term Treatment Goals: Identify both past and current emotionally unresolved life events Improve identification and management of anger triggers    Anson [place], [facility]

## 2022-01-29 ENCOUNTER — Ambulatory Visit: Admitting: Mental Health

## 2022-02-05 ENCOUNTER — Ambulatory Visit (INDEPENDENT_AMBULATORY_CARE_PROVIDER_SITE_OTHER): Admitting: Mental Health

## 2022-02-05 DIAGNOSIS — F0630 Mood disorder due to known physiological condition, unspecified: Secondary | ICD-10-CM | POA: Diagnosis not present
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
## 2022-02-19 ENCOUNTER — Ambulatory Visit: Admitting: Mental Health

## 2022-03-05 ENCOUNTER — Ambulatory Visit: Admitting: Mental Health

## 2022-03-19 ENCOUNTER — Ambulatory Visit (INDEPENDENT_AMBULATORY_CARE_PROVIDER_SITE_OTHER): Admitting: Mental Health

## 2022-03-19 DIAGNOSIS — F0630 Mood disorder due to known physiological condition, unspecified: Secondary | ICD-10-CM

## 2022-03-19 NOTE — Progress Notes (Signed)
[facility] Note Name: Darrell Bonilla Date: 03/19/2022 MRN: [id number] DOB: December 03, 1985 PCP: Patient, No Pcp Per  Time spent: 50 minutes  Treatment;   Ind. therapy  Mental Status Exam:    Appearance:    Casual     Behavior:   Appropriate  Motor:   WNL  Speech/Language:    Clear and Coherent  Affect:   Full range   Mood:   Pleasant  Thought process:   Logical, linear, goal directed  Thought content:     WNL  Sensory/Perceptual disturbances:     none  Orientation:   x4  Attention:   Good  Concentration:   Good  Memory:   Intact  Fund of knowledge:    Consistent with age and development  Insight:     Good  Judgment:    Good  Impulse Control:   Good     Reported Symptoms:  Problems with focus and attention, distractible, mood swings, negative self talk, blackouts  Risk Assessment: Danger to Self:  No Self-injurious Behavior: No Danger to Others: No Duty to Warn:no Physical Aggression / Violence:No  Access to Firearms a concern: No  Gang Involvement:No  Patient / guardian was educated about steps to take if suicide or homicide risk level increases between visits: yes While future psychiatric events cannot be accurately predicted, the patient does not currently require acute inpatient psychiatric care and does not currently meet [facility] involuntary commitment criteria.    Medications: Current Outpatient Medications  Medication Sig Dispense Refill   calcium carbonate (TUMS EX) 750 MG chewable tablet Chew 1 tablet by mouth daily as needed for heartburn.     cholecalciferol (VITAMIN D) 1000 UNITS tablet Take 1,000 Units by mouth daily.     diclofenac (VOLTAREN) 75 MG EC tablet Take 75 mg by mouth 2 (two) times daily.     ibuprofen (ADVIL,MOTRIN) 800 MG tablet Take 1 tablet (800 mg total) by mouth every 8 (eight) hours as needed. 21 tablet 0   loperamide (IMODIUM) 2 MG capsule Take 1 capsule (2 mg total) by mouth 4 (four) times daily as needed for diarrhea  or loose stools. 8 capsule 0   LORazepam (ATIVAN) 1 MG tablet Take 1 tablet (1 mg total) by mouth once. 2 tablet 0   methocarbamol (ROBAXIN) 500 MG tablet Take 1 tablet (500 mg total) by mouth 2 (two) times daily. 8 tablet 0   Multiple Vitamin (MULTIVITAMIN WITH MINERALS) TABS Take 1 tablet by mouth daily.     omega-3 acid ethyl esters (LOVAZA) 1 G capsule Take 1 g by mouth daily.     ondansetron (ZOFRAN-ODT) 4 MG disintegrating tablet Take 1 tablet (4 mg total) by mouth every 8 (eight) hours as needed for nausea. 20 tablet 0   traMADol (ULTRAM) 50 MG tablet Take 1 tablet (50 mg total) by mouth every 6 (six) hours as needed for severe pain. 15 tablet 0   Vilazodone HCl 20 MG TABS Take 20 mg by mouth daily.     No current facility-administered medications for this visit.      Subjective: Patient arrived on time for today's session.  Assess progress.  Patient shared how he has been working overtime at work over the past couple of weeks.  He stated he spends time at his wife's house in the evening to visit and spend time with his son and help him with his homework.  He stated that he wants to be able to control his anger as he stated  he can get frustrated with his son when he refuses to do the homework.  Patient stated that he will raise his voice at times and frustration, stated also that his wife can do the same although she corrects him.  He stated his son copes with ADHD and how patient can relate recalling some childhood memories in school.  Ways to manage his frustrations and prepare himself was explored collaboratively.  Patient plans to follow through with reminding himself of how he would like to react with his son, keeping in mind that his reactions can affect his son's behaviors. He stated that he has been not taking his medications recently but is considering taking them again as he knows that they can help manage his mood.    Interventions:  motivational interviewing, supportive  therapy, CBT    Diagnoses:  No diagnosis found.       Long-Term Treatment Goals: Reduce the severity and frequency of mood swings. Reduce distressful feelings related to past childhood traumas.   Short-Term Treatment Goals: Identify both past and current emotionally unresolved life events and processed thoughts and feelings related Improve identification and management of anger triggers    Waldron Session, [facility]

## 2022-04-13 ENCOUNTER — Ambulatory Visit: Admitting: Mental Health

## 2022-04-26 ENCOUNTER — Ambulatory Visit: Admitting: Mental Health

## 2022-05-10 ENCOUNTER — Ambulatory Visit (INDEPENDENT_AMBULATORY_CARE_PROVIDER_SITE_OTHER): Admitting: Mental Health

## 2022-05-10 DIAGNOSIS — F902 Attention-deficit hyperactivity disorder, combined type: Secondary | ICD-10-CM | POA: Diagnosis not present

## 2022-05-10 DIAGNOSIS — F0630 Mood disorder due to known physiological condition, unspecified: Secondary | ICD-10-CM | POA: Diagnosis not present
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
## 2022-05-24 ENCOUNTER — Ambulatory Visit: Admitting: Mental Health

## 2022-06-07 ENCOUNTER — Ambulatory Visit (INDEPENDENT_AMBULATORY_CARE_PROVIDER_SITE_OTHER): Admitting: Mental Health

## 2022-06-07 DIAGNOSIS — F0630 Mood disorder due to known physiological condition, unspecified: Secondary | ICD-10-CM | POA: Diagnosis not present
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
## 2022-06-28 ENCOUNTER — Ambulatory Visit (INDEPENDENT_AMBULATORY_CARE_PROVIDER_SITE_OTHER): Admitting: Mental Health

## 2022-06-28 DIAGNOSIS — F489 Nonpsychotic mental disorder, unspecified: Secondary | ICD-10-CM

## 2022-06-28 NOTE — Progress Notes (Signed)
No show fee for 06/28/22

## 2022-08-06 ENCOUNTER — Ambulatory Visit (INDEPENDENT_AMBULATORY_CARE_PROVIDER_SITE_OTHER): Payer: Self-pay | Admitting: Mental Health

## 2022-08-06 DIAGNOSIS — Z0389 Encounter for observation for other suspected diseases and conditions ruled out: Secondary | ICD-10-CM

## 2022-08-07 NOTE — Progress Notes (Signed)
No show 08/06/22.

## 2022-08-16 ENCOUNTER — Ambulatory Visit (INDEPENDENT_AMBULATORY_CARE_PROVIDER_SITE_OTHER): Payer: Self-pay | Admitting: Mental Health

## 2022-08-16 DIAGNOSIS — Z0389 Encounter for observation for other suspected diseases and conditions ruled out: Secondary | ICD-10-CM

## 2022-08-17 NOTE — Progress Notes (Signed)
Charge for no show 08/16/22.

## 2022-08-30 ENCOUNTER — Ambulatory Visit (INDEPENDENT_AMBULATORY_CARE_PROVIDER_SITE_OTHER): Payer: Self-pay | Admitting: Mental Health

## 2022-08-30 DIAGNOSIS — Z0389 Encounter for observation for other suspected diseases and conditions ruled out: Secondary | ICD-10-CM

## 2022-08-31 NOTE — Progress Notes (Signed)
No show charge for missed session 08/30/22.

## 2022-09-27 ENCOUNTER — Ambulatory Visit: Admitting: Mental Health

## 2022-09-29 ENCOUNTER — Emergency Department (HOSPITAL_COMMUNITY)

## 2022-09-29 ENCOUNTER — Encounter (HOSPITAL_COMMUNITY)
Admission: EM | Disposition: A | Discharge status: Home / Self Care | Payer: Self-pay | Source: Home / Self Care | Attending: Orthopedic Surgery

## 2022-09-29 ENCOUNTER — Observation Stay (HOSPITAL_COMMUNITY)

## 2022-09-29 ENCOUNTER — Encounter (HOSPITAL_COMMUNITY): Payer: Self-pay

## 2022-09-29 ENCOUNTER — Emergency Department (HOSPITAL_COMMUNITY): Admitting: Anesthesiology

## 2022-09-29 ENCOUNTER — Inpatient Hospital Stay (HOSPITAL_COMMUNITY)
Admission: EM | Admit: 2022-09-29 | Discharge: 2022-10-01 | DRG: 964 | Disposition: A | Discharge status: Home / Self Care | Attending: Orthopedic Surgery | Admitting: Orthopedic Surgery

## 2022-09-29 ENCOUNTER — Other Ambulatory Visit: Payer: Self-pay

## 2022-09-29 ENCOUNTER — Emergency Department (HOSPITAL_BASED_OUTPATIENT_CLINIC_OR_DEPARTMENT_OTHER): Admitting: Anesthesiology

## 2022-09-29 DIAGNOSIS — S73004A Unspecified dislocation of right hip, initial encounter: Secondary | ICD-10-CM | POA: Diagnosis present

## 2022-09-29 DIAGNOSIS — S0083XA Contusion of other part of head, initial encounter: Secondary | ICD-10-CM | POA: Diagnosis not present

## 2022-09-29 DIAGNOSIS — Z87891 Personal history of nicotine dependence: Secondary | ICD-10-CM | POA: Diagnosis not present

## 2022-09-29 DIAGNOSIS — Z888 Allergy status to other drugs, medicaments and biological substances status: Secondary | ICD-10-CM | POA: Diagnosis not present

## 2022-09-29 DIAGNOSIS — Z6841 Body Mass Index (BMI) 40.0 and over, adult: Secondary | ICD-10-CM

## 2022-09-29 DIAGNOSIS — Z79899 Other long term (current) drug therapy: Secondary | ICD-10-CM

## 2022-09-29 DIAGNOSIS — S32421A Displaced fracture of posterior wall of right acetabulum, initial encounter for closed fracture: Secondary | ICD-10-CM | POA: Diagnosis not present

## 2022-09-29 DIAGNOSIS — S72061A Displaced articular fracture of head of right femur, initial encounter for closed fracture: Secondary | ICD-10-CM | POA: Diagnosis not present

## 2022-09-29 DIAGNOSIS — S72051A Unspecified fracture of head of right femur, initial encounter for closed fracture: Secondary | ICD-10-CM | POA: Diagnosis present

## 2022-09-29 DIAGNOSIS — Y92410 Unspecified street and highway as the place of occurrence of the external cause: Secondary | ICD-10-CM

## 2022-09-29 DIAGNOSIS — Z881 Allergy status to other antibiotic agents status: Secondary | ICD-10-CM | POA: Diagnosis not present

## 2022-09-29 DIAGNOSIS — G4730 Sleep apnea, unspecified: Secondary | ICD-10-CM | POA: Diagnosis not present

## 2022-09-29 DIAGNOSIS — R413 Other amnesia: Secondary | ICD-10-CM | POA: Diagnosis present

## 2022-09-29 DIAGNOSIS — S73014A Posterior dislocation of right hip, initial encounter: Principal | ICD-10-CM | POA: Diagnosis present

## 2022-09-29 DIAGNOSIS — S72001A Fracture of unspecified part of neck of right femur, initial encounter for closed fracture: Secondary | ICD-10-CM

## 2022-09-29 HISTORY — DX: Sleep apnea, unspecified: G47.30

## 2022-09-29 HISTORY — PX: HIP CLOSED REDUCTION: SHX983

## 2022-09-29 LAB — COMPREHENSIVE METABOLIC PANEL
ALT: 45 U/L — ABNORMAL HIGH (ref 0–44)
AST: 40 U/L (ref 15–41)
Albumin: 3.9 g/dL (ref 3.5–5.0)
Alkaline Phosphatase: 49 U/L (ref 38–126)
Anion gap: 16 — ABNORMAL HIGH (ref 5–15)
BUN: 14 mg/dL (ref 6–20)
CO2: 21 mmol/L — ABNORMAL LOW (ref 22–32)
Calcium: 9 mg/dL (ref 8.9–10.3)
Chloride: 103 mmol/L (ref 98–111)
Creatinine, Ser: 1.05 mg/dL (ref 0.61–1.24)
GFR, Estimated: >60 mL/min (ref >60)
Glucose, Bld: 127 mg/dL — ABNORMAL HIGH (ref 70–99)
Potassium: 3.8 mmol/L (ref 3.5–5.1)
Sodium: 140 mmol/L (ref 135–145)
Total Bilirubin: 0.7 mg/dL (ref 0.3–1.2)
Total Protein: 7 g/dL (ref 6.5–8.1)

## 2022-09-29 LAB — CBC
HCT: 44.6 % (ref 39.0–52.0)
Hemoglobin: 15.3 g/dL (ref 13.0–17.0)
Hemoglobin: 15.1 g/dL (ref 13.0–17.0)
MCH: 29.9 pg (ref 26.0–34.0)
MCH: 30.1 pg (ref 26.0–34.0)
MCHC: 34.3 g/dL (ref 30.0–36.0)
MCHC: 35.1 g/dL (ref 30.0–36.0)
MCV: 87.3 fL (ref 80.0–100.0)
Platelets: 221 10*3/uL (ref 150–400)
RBC: 5.11 MIL/uL (ref 4.22–5.81)
RBC: 5.01 MIL/uL (ref 4.22–5.81)
RDW: 12.5 % (ref 11.5–15.5)
WBC: 11.2 10*3/uL — ABNORMAL HIGH (ref 4.0–10.5)
nRBC: 0 % (ref 0.0–0.2)

## 2022-09-29 LAB — I-STAT CHEM 8, ED
BUN: 16 mg/dL (ref 6–20)
Calcium, Ion: 1.11 mmol/L — ABNORMAL LOW (ref 1.15–1.40)
Chloride: 107 mmol/L (ref 98–111)
Creatinine, Ser: 1 mg/dL (ref 0.61–1.24)
Glucose, Bld: 124 mg/dL — ABNORMAL HIGH (ref 70–99)
HCT: 45 % (ref 39.0–52.0)
Hemoglobin: 15.3 g/dL (ref 13.0–17.0)
Potassium: 3.8 mmol/L (ref 3.5–5.1)
Sodium: 142 mmol/L (ref 135–145)
TCO2: 23 mmol/L (ref 22–32)

## 2022-09-29 LAB — PROTIME-INR
INR: 0.9 (ref 0.8–1.2)
Prothrombin Time: 12.8 seconds (ref 11.4–15.2)

## 2022-09-29 LAB — URINALYSIS, ROUTINE W REFLEX MICROSCOPIC
Bilirubin Urine: NEGATIVE
Glucose, UA: NEGATIVE mg/dL
Hgb urine dipstick: NEGATIVE
Ketones, ur: NEGATIVE mg/dL
Leukocytes,Ua: NEGATIVE
Nitrite: NEGATIVE
Protein, ur: NEGATIVE mg/dL
Specific Gravity, Urine: >1.046 — ABNORMAL HIGH (ref 1.005–1.030)
pH: 6 (ref 5.0–8.0)

## 2022-09-29 LAB — SAMPLE TO BLOOD BANK

## 2022-09-29 LAB — CREATININE, SERUM
Creatinine, Ser: 1.22 mg/dL (ref 0.61–1.24)
GFR, Estimated: >60 mL/min (ref >60)

## 2022-09-29 LAB — ETHANOL: Alcohol, Ethyl (B): <10 mg/dL (ref <10)

## 2022-09-29 LAB — LACTIC ACID, PLASMA: Lactic Acid, Venous: 1.9 mmol/L (ref 0.5–1.9)

## 2022-09-29 SURGERY — CLOSED REDUCTION, HIP
Anesthesia: General | Site: Hip | Laterality: Right

## 2022-09-29 MED ORDER — LIDOCAINE 2% (20 MG/ML) 5 ML SYRINGE
INTRAMUSCULAR | Status: DC | PRN
  Administered 2022-09-29: 60 mg via INTRAVENOUS

## 2022-09-29 MED ORDER — VANCOMYCIN HCL 1500 MG/300ML IV SOLN
1500.0000 mg | INTRAVENOUS | Status: DC
  Filled 2022-09-29: qty 300

## 2022-09-29 MED ORDER — ENOXAPARIN SODIUM 40 MG/0.4ML IJ SOSY
40.0000 mg | PREFILLED_SYRINGE | INTRAMUSCULAR | Status: DC
  Administered 2022-09-30: 40 mg via SUBCUTANEOUS
  Filled 2022-09-29: qty 0.4

## 2022-09-29 MED ORDER — CELECOXIB 200 MG PO CAPS
ORAL_CAPSULE | ORAL | Status: AC
  Administered 2022-09-29: 200 mg via ORAL
  Filled 2022-09-29: qty 1

## 2022-09-29 MED ORDER — DOCUSATE SODIUM 100 MG PO CAPS
100.0000 mg | ORAL_CAPSULE | Freq: Two times a day (BID) | ORAL | Status: DC
  Administered 2022-09-29 – 2022-10-01 (×4): 100 mg via ORAL
  Filled 2022-09-29 (×4): qty 1

## 2022-09-29 MED ORDER — METOCLOPRAMIDE HCL 5 MG PO TABS: 5.0000 mg | ORAL_TABLET | Freq: Three times a day (TID) | ORAL | Status: DC | PRN

## 2022-09-29 MED ORDER — FENTANYL CITRATE (PF) 100 MCG/2ML IJ SOLN
INTRAMUSCULAR | Status: DC | PRN
  Administered 2022-09-29: 50 ug via INTRAVENOUS

## 2022-09-29 MED ORDER — ORAL CARE MOUTH RINSE: 15.0000 mL | Freq: Once | OROMUCOSAL | Status: AC

## 2022-09-29 MED ORDER — FENTANYL CITRATE PF 50 MCG/ML IJ SOSY: 50.0000 ug | PREFILLED_SYRINGE | Freq: Once | INTRAMUSCULAR | Status: AC

## 2022-09-29 MED ORDER — ONDANSETRON HCL 4 MG/2ML IJ SOLN
INTRAMUSCULAR | Status: DC | PRN
  Administered 2022-09-29: 4 mg via INTRAVENOUS

## 2022-09-29 MED ORDER — ONDANSETRON HCL 4 MG/2ML IJ SOLN
4.0000 mg | Freq: Once | INTRAMUSCULAR | Status: AC
  Administered 2022-09-29: 4 mg via INTRAVENOUS
  Filled 2022-09-29: qty 2

## 2022-09-29 MED ORDER — METHOCARBAMOL 500 MG PO TABS
500.0000 mg | ORAL_TABLET | Freq: Two times a day (BID) | ORAL | Status: DC
  Administered 2022-09-29 – 2022-10-01 (×4): 500 mg via ORAL
  Filled 2022-09-29 (×4): qty 1

## 2022-09-29 MED ORDER — ACETAMINOPHEN 500 MG PO TABS: 1000.0000 mg | ORAL_TABLET | Freq: Once | ORAL | Status: AC

## 2022-09-29 MED ORDER — ONDANSETRON HCL 4 MG PO TABS
4.0000 mg | ORAL_TABLET | Freq: Four times a day (QID) | ORAL | Status: DC | PRN
  Administered 2022-09-30 – 2022-10-01 (×2): 4 mg via ORAL
  Filled 2022-09-29 (×2): qty 1

## 2022-09-29 MED ORDER — MORPHINE SULFATE (PF) 2 MG/ML IV SOLN: 0.5000 mg | INTRAVENOUS | Status: DC | PRN

## 2022-09-29 MED ORDER — LACTATED RINGERS IV SOLN: INTRAVENOUS | Status: DC

## 2022-09-29 MED ORDER — PROPOFOL 10 MG/ML IV BOLUS
0.5000 mg/kg | Freq: Once | INTRAVENOUS | Status: DC
  Filled 2022-09-29: qty 20

## 2022-09-29 MED ORDER — ACETAMINOPHEN 325 MG PO TABS
325.0000 mg | ORAL_TABLET | Freq: Four times a day (QID) | ORAL | Status: DC | PRN
  Administered 2022-09-30 – 2022-10-01 (×2): 650 mg via ORAL
  Filled 2022-09-29 (×2): qty 2

## 2022-09-29 MED ORDER — ACETAMINOPHEN 500 MG PO TABS
500.0000 mg | ORAL_TABLET | Freq: Four times a day (QID) | ORAL | Status: AC
  Administered 2022-09-29 – 2022-09-30 (×4): 500 mg via ORAL
  Filled 2022-09-29 (×4): qty 1

## 2022-09-29 MED ORDER — MIDAZOLAM HCL 2 MG/2ML IJ SOLN
INTRAMUSCULAR | Status: DC | PRN
  Administered 2022-09-29: 2 mg via INTRAVENOUS

## 2022-09-29 MED ORDER — VILAZODONE HCL 20 MG PO TABS
20.0000 mg | ORAL_TABLET | Freq: Every day | ORAL | Status: DC
  Filled 2022-09-29 (×2): qty 1

## 2022-09-29 MED ORDER — METOCLOPRAMIDE HCL 5 MG/ML IJ SOLN: 5.0000 mg | Freq: Three times a day (TID) | INTRAMUSCULAR | Status: DC | PRN

## 2022-09-29 MED ORDER — DEXAMETHASONE SODIUM PHOSPHATE 10 MG/ML IJ SOLN
INTRAMUSCULAR | Status: DC | PRN
  Administered 2022-09-29: 10 mg via INTRAVENOUS

## 2022-09-29 MED ORDER — DICLOFENAC SODIUM 75 MG PO TBEC
75.0000 mg | DELAYED_RELEASE_TABLET | Freq: Two times a day (BID) | ORAL | Status: DC
  Administered 2022-09-29 – 2022-10-01 (×4): 75 mg via ORAL
  Filled 2022-09-29 (×7): qty 1

## 2022-09-29 MED ORDER — MIDAZOLAM HCL 2 MG/2ML IJ SOLN
INTRAMUSCULAR | Status: AC
  Filled 2022-09-29: qty 2

## 2022-09-29 MED ORDER — FENTANYL CITRATE PF 50 MCG/ML IJ SOSY
PREFILLED_SYRINGE | INTRAMUSCULAR | Status: AC
  Filled 2022-09-29: qty 1

## 2022-09-29 MED ORDER — ACETAMINOPHEN 500 MG PO TABS
ORAL_TABLET | ORAL | Status: AC
  Administered 2022-09-29: 1000 mg via ORAL
  Filled 2022-09-29: qty 2

## 2022-09-29 MED ORDER — HYDROCODONE-ACETAMINOPHEN 7.5-325 MG PO TABS
1.0000 | ORAL_TABLET | ORAL | Status: DC | PRN
  Administered 2022-09-29 – 2022-09-30 (×4): 2 via ORAL
  Filled 2022-09-29 (×4): qty 2

## 2022-09-29 MED ORDER — PROMETHAZINE HCL 25 MG/ML IJ SOLN: 6.2500 mg | INTRAMUSCULAR | Status: DC | PRN

## 2022-09-29 MED ORDER — HYDROCODONE-ACETAMINOPHEN 5-325 MG PO TABS: 1.0000 | ORAL_TABLET | ORAL | Status: DC | PRN

## 2022-09-29 MED ORDER — CHLORHEXIDINE GLUCONATE 0.12 % MT SOLN
OROMUCOSAL | Status: AC
  Administered 2022-09-29: 15 mL via OROMUCOSAL
  Filled 2022-09-29: qty 15

## 2022-09-29 MED ORDER — IOHEXOL 350 MG/ML SOLN
75.0000 mL | Freq: Once | INTRAVENOUS | Status: AC | PRN
  Administered 2022-09-29: 75 mL via INTRAVENOUS

## 2022-09-29 MED ORDER — HYDROMORPHONE HCL 1 MG/ML IJ SOLN
1.0000 mg | Freq: Once | INTRAMUSCULAR | Status: AC
  Administered 2022-09-29: 1 mg via INTRAVENOUS
  Filled 2022-09-29: qty 1

## 2022-09-29 MED ORDER — PROPOFOL 10 MG/ML IV BOLUS
INTRAVENOUS | Status: DC | PRN
  Administered 2022-09-29 (×2): 40 mg via INTRAVENOUS
  Administered 2022-09-29 (×2): 60 mg via INTRAVENOUS

## 2022-09-29 MED ORDER — FENTANYL CITRATE (PF) 250 MCG/5ML IJ SOLN
INTRAMUSCULAR | Status: AC
  Filled 2022-09-29: qty 5

## 2022-09-29 MED ORDER — FENTANYL CITRATE PF 50 MCG/ML IJ SOSY
PREFILLED_SYRINGE | INTRAMUSCULAR | Status: AC
  Administered 2022-09-29: 50 ug via INTRAVENOUS
  Filled 2022-09-29: qty 1

## 2022-09-29 MED ORDER — FENTANYL CITRATE (PF) 100 MCG/2ML IJ SOLN: 25.0000 ug | INTRAMUSCULAR | Status: DC | PRN

## 2022-09-29 MED ORDER — SUCCINYLCHOLINE CHLORIDE 200 MG/10ML IV SOSY
PREFILLED_SYRINGE | INTRAVENOUS | Status: DC | PRN
  Administered 2022-09-29: 120 mg via INTRAVENOUS

## 2022-09-29 MED ORDER — CELECOXIB 200 MG PO CAPS: 200.0000 mg | ORAL_CAPSULE | Freq: Once | ORAL | Status: AC

## 2022-09-29 MED ORDER — PROPOFOL 10 MG/ML IV BOLUS
INTRAVENOUS | Status: DC | PRN
  Administered 2022-09-29: 250 mg via INTRAVENOUS

## 2022-09-29 MED ORDER — FENTANYL CITRATE PF 50 MCG/ML IJ SOSY
50.0000 ug | PREFILLED_SYRINGE | Freq: Once | INTRAMUSCULAR | Status: AC
  Administered 2022-09-29: 50 ug via INTRAVENOUS
  Filled 2022-09-29: qty 1

## 2022-09-29 MED ORDER — OXYCODONE HCL 5 MG PO TABS
5.0000 mg | ORAL_TABLET | Freq: Once | ORAL | Status: AC | PRN
  Administered 2022-09-29: 5 mg via ORAL

## 2022-09-29 MED ORDER — LEVOFLOXACIN IN D5W 500 MG/100ML IV SOLN: 500.0000 mg | INTRAVENOUS | Status: DC

## 2022-09-29 MED ORDER — FENTANYL CITRATE (PF) 250 MCG/5ML IJ SOLN
INTRAMUSCULAR | Status: DC | PRN
  Administered 2022-09-29 (×2): 50 ug via INTRAVENOUS

## 2022-09-29 MED ORDER — ONDANSETRON HCL 4 MG/2ML IJ SOLN
4.0000 mg | Freq: Four times a day (QID) | INTRAMUSCULAR | Status: DC | PRN
  Administered 2022-09-29: 4 mg via INTRAVENOUS
  Filled 2022-09-29: qty 2

## 2022-09-29 MED ORDER — OXYCODONE HCL 5 MG PO TABS
ORAL_TABLET | ORAL | Status: AC
  Filled 2022-09-29: qty 1

## 2022-09-29 MED ORDER — OXYCODONE HCL 5 MG/5ML PO SOLN: 5.0000 mg | Freq: Once | ORAL | Status: AC | PRN

## 2022-09-29 MED ORDER — CHLORHEXIDINE GLUCONATE 0.12 % MT SOLN: 15.0000 mL | Freq: Once | OROMUCOSAL | Status: AC
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
## 2022-09-29 NOTE — ED Notes (Signed)
Pt transported to PACU [facility] with RN. All of belongings went home with patient wife at this time. Pt is a&ox4,VSS,NAD. Pt is aware and agreeable to plan at this time.
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
## 2022-09-29 NOTE — Anesthesia Preprocedure Evaluation (Addendum)
Anesthesia Evaluation  Patient identified by MRN, date of birth, ID band Patient awake    Reviewed: Allergy & Precautions, NPO status , Patient's Chart, lab work & pertinent test results  History of Anesthesia Complications Negative for: history of anesthetic complications  Airway Mallampati: III  TM Distance: >3 FB Neck ROM: Full    Dental  (+) Dental Advisory Given, Chipped,    Pulmonary sleep apnea (noncompliant) , former smoker   Pulmonary exam normal        Cardiovascular negative cardio ROS Normal cardiovascular exam     Neuro/Psych  PSYCHIATRIC DISORDERS Anxiety Depression    negative neurological ROS     GI/Hepatic negative GI ROS, Neg liver ROS,,,  Endo/Other    Morbid obesity  Renal/GU negative Renal ROS     Musculoskeletal negative musculoskeletal ROS (+)    Abdominal  (+) + obese  Peds  Hematology negative hematology ROS (+)   Anesthesia Other Findings   Reproductive/Obstetrics                             Anesthesia Physical Anesthesia Plan  ASA: 3  Anesthesia Plan: General   Post-op Pain Management: Tylenol PO (pre-op)* and Celebrex PO (pre-op)*   Induction: Intravenous  PONV Risk Score and Plan: 2 and Treatment may vary due to age or medical condition, Ondansetron, Dexamethasone and Midazolam  Airway Management Planned: Oral ETT  Additional Equipment: None  Intra-op Plan:   Post-operative Plan: Extubation in OR  Informed Consent: I have reviewed the patients History and Physical, chart, labs and discussed the procedure including the risks, benefits and alternatives for the proposed anesthesia with the patient or authorized representative who has indicated his/her understanding and acceptance.     Dental advisory given  Plan Discussed with: CRNA and Anesthesiologist  Anesthesia Plan Comments:         Anesthesia Quick Evaluation

## 2022-09-29 NOTE — ED Provider Notes (Signed)
.  Sedation  Date/Time: 09/29/2022 8:24 PM  Performed by: Terald Sleeper, MD Authorized by: Terald Sleeper, MD   Consent:    Consent obtained:  Verbal   Consent given by:  Patient and spouse   Risks discussed:  Dysrhythmia, allergic reaction, nausea, inadequate sedation, vomiting, respiratory compromise necessitating ventilatory assistance and intubation, prolonged sedation necessitating reversal and prolonged hypoxia resulting in organ damage   Alternatives discussed:  Analgesia without sedation Universal protocol:    Procedure explained and questions answered to patient or proxy's satisfaction: yes     Relevant documents present and verified: yes     Test results available: yes     Imaging studies available: yes     Required blood products, implants, devices, and special equipment available: yes     Site/side marked: yes     Immediately prior to procedure, a time out was called: yes     Patient identity confirmed:  Arm band Indications:    Procedure performed:  Dislocation reduction   Procedure necessitating sedation performed by:  Different physician Pre-sedation assessment:    Time since last food or drink:  6   ASA classification: class 2 - patient with mild systemic disease     Mouth opening:  2 finger widths   Mallampati score:  III - soft palate, base of uvula visible   Neck mobility: normal     Pre-sedation assessments completed and reviewed: airway patency, cardiovascular function, hydration status, mental status, nausea/vomiting, pain level, respiratory function and temperature   Immediate pre-procedure details:    Reassessment: Patient reassessed immediately prior to procedure     Reviewed: vital signs, relevant labs/tests and NPO status     Verified: bag valve mask available, emergency equipment available, intubation equipment available, IV patency confirmed, oxygen available and reversal medications available   Procedure details (see MAR for exact dosages):     Preoxygenation:  Nasal cannula   Sedation:  Propofol   Intended level of sedation: deep   Analgesia:  Fentanyl   Intra-procedure monitoring:  Blood pressure monitoring, cardiac monitor, continuous capnometry, continuous pulse oximetry, frequent vital sign checks and frequent LOC assessments   Intra-procedure events: none     Total Provider sedation time (minutes):  30 Post-procedure details:    Attendance: Constant attendance by certified staff until patient recovered     Recovery: Patient returned to pre-procedure baseline     Post-sedation assessments completed and reviewed: airway patency, cardiovascular function, hydration status, mental status, nausea/vomiting, pain level, respiratory function and temperature     Patient is stable for discharge or admission: yes     Procedure completion:  Tolerated well, no immediate complications     Terald Sleeper, MD 09/29/22 2026

## 2022-09-29 NOTE — Progress Notes (Signed)
Orthopedic Tech Progress Note Patient Details:  Darrell Bonilla May 12, 1985 [id number]  Ortho Devices Type of Ortho Device: Knee Immobilizer Ortho Device/Splint Location: RLE Ortho Device/Splint Interventions: Application   Post Interventions Patient Tolerated: Well  Darrell Bonilla 09/29/2022, 1:23 PM

## 2022-09-29 NOTE — Transfer of Care (Signed)
Immediate Anesthesia Transfer of Care Note  Patient: Darrell Bonilla  Procedure(s) Performed: CLOSED REDUCTION HIP (Right: Hip)  Patient Location: PACU  Anesthesia Type:General  Level of Consciousness: drowsy  Airway & Oxygen Therapy: Patient Spontanous Breathing and Patient connected to face mask oxygen  Post-op Assessment: Report given to RN and Post -op Vital signs reviewed and stable  Post vital signs: Reviewed and stable  Last Vitals:  Vitals Value Taken Time  BP 112/59 09/29/22 1508  Temp    Pulse 100 09/29/22 1512  Resp 17 09/29/22 1512  SpO2 97 % 09/29/22 1512  Vitals shown include unvalidated device data.  Last Pain:  Vitals:   09/29/22 1300  TempSrc: Oral  PainSc:       Patients Stated Pain Goal: 4 (09/29/22 1124)  Complications: No notable events documented.

## 2022-09-29 NOTE — ED Notes (Signed)
Report called to PACU RN. Pt to go to PACU [facility].

## 2022-09-29 NOTE — Brief Op Note (Signed)
09/29/2022  3:07 PM  PATIENT:  Darrell Bonilla  37 y.o. male  PRE-OPERATIVE DIAGNOSIS:  Right Hip Dislocation  POST-OPERATIVE DIAGNOSIS:  Right Hip Dislocation  PROCEDURE:  Procedure(s): CLOSED REDUCTION HIP (Right)  SURGEON:  Surgeon(s) and Role:    * Yolonda Kida, MD - Primary  PHYSICIAN ASSISTANT: none    ANESTHESIA:   general  EBL:0 cc  BLOOD ADMINISTERED:none  DRAINS: none   LOCAL MEDICATIONS USED:  NONE  SPECIMEN:  No Specimen  DISPOSITION OF SPECIMEN:  N/A  COUNTS:  YES  TOURNIQUET:  * No tourniquets in log *  DICTATION: .Note written in EPIC  PLAN OF CARE: Admit for overnight observation  PATIENT DISPOSITION:  PACU - hemodynamically stable.   Delay start of Pharmacological VTE agent (>24hrs) due to surgical blood loss or risk of bleeding: not applicable

## 2022-09-29 NOTE — Anesthesia Procedure Notes (Signed)
Procedure Name: Intubation Date/Time: 09/29/2022 2:39 PM  Performed by: Zollie Beckers, CRNAPre-anesthesia Checklist: Patient identified, Emergency Drugs available, Suction available and Patient being monitored Patient Re-evaluated:Patient Re-evaluated prior to induction Oxygen Delivery Method: Circle system utilized Preoxygenation: Pre-oxygenation with 100% oxygen Induction Type: IV induction Ventilation: Mask ventilation without difficulty Laryngoscope Size: Mac and 4 Grade View: Grade I Tube type: Oral Number of attempts: 1 Airway Equipment and Method: Stylet and Oral airway Placement Confirmation: ETT inserted through vocal cords under direct vision, positive ETCO2 and breath sounds checked- equal and bilateral Tube secured with: Tape Dental Injury: Teeth and Oropharynx as per pre-operative assessment

## 2022-09-29 NOTE — Anesthesia Postprocedure Evaluation (Signed)
Anesthesia Post Note  Patient: Darrell Bonilla  Procedure(s) Performed: CLOSED REDUCTION HIP (Right: Hip)     Patient location during evaluation: PACU Anesthesia Type: General Level of consciousness: awake and alert Pain management: pain level controlled Vital Signs Assessment: post-procedure vital signs reviewed and stable Respiratory status: spontaneous breathing, nonlabored ventilation and respiratory function stable Cardiovascular status: stable and blood pressure returned to baseline Anesthetic complications: no   No notable events documented.  Last Vitals:  Vitals:   09/29/22 1545 09/29/22 1628  BP: (!) 131/92 126/70  Pulse: (!) 104 97  Resp: 15 20  Temp: 36.5 C 37.1 C  SpO2: 94% 96%    Last Pain:  Vitals:   09/29/22 1628  TempSrc: Oral  PainSc: 2                  Beryle Lathe

## 2022-09-29 NOTE — Progress Notes (Signed)
This PACU RN took patient to CT once recovered from surgery and stable, then to patient room. Report given to Mo, Charity fundraiser.

## 2022-09-29 NOTE — Plan of Care (Signed)

## 2022-09-30 ENCOUNTER — Encounter (HOSPITAL_COMMUNITY): Payer: Self-pay | Admitting: Orthopedic Surgery

## 2022-09-30 DIAGNOSIS — Z888 Allergy status to other drugs, medicaments and biological substances status: Secondary | ICD-10-CM | POA: Diagnosis not present

## 2022-09-30 DIAGNOSIS — R413 Other amnesia: Secondary | ICD-10-CM | POA: Diagnosis present

## 2022-09-30 DIAGNOSIS — G4730 Sleep apnea, unspecified: Secondary | ICD-10-CM | POA: Diagnosis present

## 2022-09-30 DIAGNOSIS — S0083XA Contusion of other part of head, initial encounter: Secondary | ICD-10-CM | POA: Diagnosis present

## 2022-09-30 DIAGNOSIS — Y92410 Unspecified street and highway as the place of occurrence of the external cause: Secondary | ICD-10-CM | POA: Diagnosis not present

## 2022-09-30 DIAGNOSIS — S32421A Displaced fracture of posterior wall of right acetabulum, initial encounter for closed fracture: Secondary | ICD-10-CM | POA: Diagnosis present

## 2022-09-30 DIAGNOSIS — S72051A Unspecified fracture of head of right femur, initial encounter for closed fracture: Secondary | ICD-10-CM | POA: Diagnosis present

## 2022-09-30 DIAGNOSIS — Z87891 Personal history of nicotine dependence: Secondary | ICD-10-CM | POA: Diagnosis not present

## 2022-09-30 DIAGNOSIS — S72061A Displaced articular fracture of head of right femur, initial encounter for closed fracture: Secondary | ICD-10-CM | POA: Diagnosis present

## 2022-09-30 DIAGNOSIS — Z6841 Body Mass Index (BMI) 40.0 and over, adult: Secondary | ICD-10-CM | POA: Diagnosis not present

## 2022-09-30 DIAGNOSIS — Z881 Allergy status to other antibiotic agents status: Secondary | ICD-10-CM | POA: Diagnosis not present

## 2022-09-30 DIAGNOSIS — S73014A Posterior dislocation of right hip, initial encounter: Secondary | ICD-10-CM | POA: Diagnosis present

## 2022-09-30 DIAGNOSIS — Z79899 Other long term (current) drug therapy: Secondary | ICD-10-CM | POA: Diagnosis not present

## 2022-09-30 MED ORDER — PHENOL 1.4 % MT LIQD
1.0000 | OROMUCOSAL | Status: DC | PRN
  Administered 2022-09-30: 1 via OROMUCOSAL
  Filled 2022-09-30: qty 177

## 2022-09-30 MED ORDER — VILAZODONE HCL 20 MG PO TABS
20.0000 mg | ORAL_TABLET | Freq: Every day | ORAL | Status: DC
  Administered 2022-09-30 – 2022-10-01 (×2): 20 mg via ORAL
  Filled 2022-09-30 (×5): qty 1

## 2022-09-30 MED ORDER — ZOLPIDEM TARTRATE 5 MG PO TABS
5.0000 mg | ORAL_TABLET | Freq: Every evening | ORAL | Status: DC | PRN
  Administered 2022-09-30 (×2): 5 mg via ORAL
  Filled 2022-09-30 (×2): qty 1

## 2022-09-30 NOTE — Plan of Care (Signed)
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
## 2022-09-30 NOTE — Progress Notes (Signed)
Subjective: 1 Day Post-Op Procedure(s) (LRB): CLOSED REDUCTION HIP (Right) Patient reports pain as mild.  CT completed showing loose fragments in the joint up to 16 mm.  Pt has a h/o PAO by Dr. Constance Goltz at [facility] for hip dysplasia.  Objective: Vital signs in last 24 hours: Temp:  [97.4 F (36.3 C)-98.8 F (37.1 C)] 97.9 F (36.6 C) (06/30 0737) Pulse Rate:  [68-106] 78 (06/30 0737) Resp:  [12-21] 18 (06/30 0737) BP: (112-142)/(59-97) 113/76 (06/30 0737) SpO2:  [94 %-100 %] 100 % (06/30 0737) Weight:  [120.2 kg] 120.2 kg (06/29 1628)  Intake/Output from previous day: 06/29 0701 - 06/30 0700 In: -  Out: 850 [Urine:850] Intake/Output this shift: No intake/output data recorded.  Recent Labs    09/29/22 0721 09/29/22 0727 09/29/22 1658  HGB 15.3 15.3 15.1   Recent Labs    09/29/22 0721 09/29/22 0727 09/29/22 1658  WBC 11.2*  --  14.3*  RBC 5.11  --  5.01  HCT 44.6 45.0 43.0  PLT 221  --  225   Recent Labs    09/29/22 0721 09/29/22 0727 09/29/22 1658  NA 140 142  --   K 3.8 3.8  --   CL 103 107  --   CO2 21*  --   --   BUN 14 16  --   CREATININE 1.05 1.00 1.22  GLUCOSE 127* 124*  --   CALCIUM 9.0  --   --    Recent Labs    09/29/22 0721  INR 0.9    PE:  wn wd male in nad.  A and O.  Minimal pain with log roll of R LE.   Assessment/Plan: 1 Day Post-Op Procedure(s) (LRB): CLOSED REDUCTION HIP (Right) Dr. Aundria Rud to review CT findings with Dr. Jena Gauss today to plan further treatment.  NPO after MN in case he needs more surgery.  He had lovenox already today, but we'll hold tomorrow's dose.      Toni Arthurs 09/30/2022, 9:49 AM

## 2022-09-30 NOTE — Progress Notes (Signed)
Updated plan:   I have spoken with Ortho Trauma.. plan for nonop treatment with TDWB x 1 month.  Ok for up with PT tomorrow and hopeful DC home.

## 2022-10-01 MED ORDER — ONDANSETRON 4 MG PO TBDP: 4.0000 mg | ORAL_TABLET | Freq: Three times a day (TID) | ORAL | 0 refills | Status: DC | PRN

## 2022-10-01 MED ORDER — ASPIRIN 81 MG PO TBEC: 81.0000 mg | DELAYED_RELEASE_TABLET | Freq: Every day | ORAL | 0 refills | Status: AC

## 2022-10-01 MED ORDER — ASPIRIN 81 MG PO TBEC
81.0000 mg | DELAYED_RELEASE_TABLET | Freq: Every day | ORAL | Status: DC
  Administered 2022-10-01: 81 mg via ORAL
  Filled 2022-10-01: qty 1

## 2022-10-01 MED ORDER — METHOCARBAMOL 500 MG PO TABS: 500.0000 mg | ORAL_TABLET | Freq: Four times a day (QID) | ORAL | 1 refills | Status: AC | PRN

## 2022-10-01 MED ORDER — OXYCODONE HCL 5 MG PO TABS: 5.0000 mg | ORAL_TABLET | Freq: Four times a day (QID) | ORAL | 0 refills | Status: DC | PRN
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
## 2022-10-01 NOTE — Progress Notes (Signed)
   Subjective:  Darrell Bonilla is a 37 y.o. male, 2 Days Post-Op    s/p Procedure(s): CLOSED REDUCTION HIP   Patient reports pain as mild to moderate.  No n/t, no fever or chills. Has had BM, did well with PT.   Objective:   VITALS:   Vitals:   09/30/22 0737 09/30/22 1659 09/30/22 1942 10/01/22 0449  BP: 113/76 119/76 116/72 116/64  Pulse: 78 96 94 79  Resp: 18 18 17 17   Temp: 97.9 F (36.6 C) 98 F (36.7 C) 98.5 F (36.9 C) 98.2 F (36.8 C)  TempSrc: Oral  Oral   SpO2: 100% 95% 96% 95%  Weight:      Height:       In hospital bed, NAD.   RLE:     Neurovascular intact Sensation intact distally Intact pulses distally Dorsiflexion/Plantar flexion intact Compartment soft  Calf soft Lab Results  Component Value Date   WBC 11.2 (H) 09/29/2022   HGB 15.3 09/29/2022   HCT 45.0 09/29/2022   MCV 87.3 09/29/2022   PLT 221 09/29/2022   BMET    Component Value Date/Time   NA 142 09/29/2022 0727   K 3.8 09/29/2022 0727   CL 107 09/29/2022 0727   CO2 21 (L) 09/29/2022 0721   GLUCOSE 124 (H) 09/29/2022 0727   BUN 16 09/29/2022 0727   CREATININE 1.00 09/29/2022 0727   CALCIUM 9.0 09/29/2022 0721   GFRNONAA >60 09/29/2022 0721     Assessment/Plan: 2 Days Post-Op   Principal Problem:   Closed dislocation of right hip (HCC)   Advance diet Up with therapy  Dispo: stable for discharge -will just need 3 in1 and rolling walker .  Weightbearing Status: TDWB RLE in knee immobilizer - keep immobilizer on at all times except showering until follow up in 2 weeks. Posterior hip precautions x1 month.  DVT Prophylaxis: Aspirin 81mg    Follow up in 2 weeks for Repeat exam and xrays.   Arbie Cookey 10/01/2022, 8:45 AM  Dion Saucier PA-C  Physician Assistant with Dr. Rebekah [place]
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
## 2022-10-01 NOTE — Plan of Care (Signed)
  Problem: Pain Managment: Goal: General experience of comfort will improve Outcome: Progressing   Problem: Safety: Goal: Ability to remain free from injury will improve Outcome: Progressing   Problem: Skin Integrity: Goal: Risk for impaired skin integrity will decrease Outcome: Progressing   
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
## 2022-10-01 NOTE — TOC Transition Note (Signed)
Transition of Care [facility]) - CM/SW Discharge Note   Patient Details  Name: Darrell Bonilla MRN: [id number] Date of Birth: [birth year]  Transition of Care [facility]) CM/SW Contact:  Epifanio Lesches, RN Phone Number: 10/01/2022, 3:35 PM   Clinical Narrative:    Patient will DC to: Home  Anticipated DC date: 10/01/2022 Family notified: yes Transport by: car       - Closed dislocation of right hip      - s/p closed reduction right hip,6/30  Per MD patient ready for DC today. RN, patient, and  patient's wife notified of DC. Order noted for DME. Pt without provider preference. Referral made with [employer]. Equipment will be delivered to bedside prior to d/c. Pt without Rx med concerns. Wife to provide transportation to home. Post  hospital follow up noted on AVS.  RNCM will sign off for now as intervention is no longer needed. Please consult [place] again if new needs arise.   Final next level of care: Home/Self Care Barriers to Discharge: Continued Medical Work up   Patient Goals and CMS Choice      Discharge Placement     Discharge Plan and Services Additional resources added to the After Visit Summary for                  DME Arranged: Bedside commode, Walker rolling   Date DME Agency Contacted: 10/01/22 Time DME Agency Contacted: [phone] Representative spoke with at DME Agency: Ian Malkin            Social Determinants of Health (SDOH) Interventions SDOH Screenings   Food Insecurity: No Food Insecurity (09/29/2022)  Housing: Low Risk  (09/29/2022)  Transportation Needs: No Transportation Needs (09/29/2022)  Utilities: Not At Risk (09/29/2022)  Tobacco Use: Medium Risk (09/30/2022)     Readmission Risk Interventions     No data to display

## 2022-10-01 NOTE — Progress Notes (Addendum)
    Durable Medical Equipment  (From admission, onward)           Start     Ordered   10/01/22 1503  For home use only DME Shower stool  Once        10/01/22 1502   10/01/22 1444  For home use only DME Walker rolling  Once       Question Answer Comment  Walker: With 5 Inch Wheels   Patient needs a walker to treat with the following condition Closed dislocation of right hip, initial encounter (HCC)      10/01/22 1444   10/01/22 1444  For home use only DME 3 n 1  Once        10/01/22 1444

## 2022-10-01 NOTE — Progress Notes (Signed)
AVS given and reviewed with pt. Medications discussed. DME delivered to bedside. All questions answered to satisfaction. Pt verbalized understanding of information given. Pt escorted off the unit with all belongings via wheelchair by staff member.

## 2022-10-11 ENCOUNTER — Ambulatory Visit: Admitting: Mental Health

## 2022-12-17 ENCOUNTER — Ambulatory Visit (HOSPITAL_COMMUNITY)
Admission: EM | Admit: 2022-12-17 | Discharge: 2022-12-17 | Disposition: A | Discharge status: Home / Self Care | Attending: Physician Assistant | Admitting: Physician Assistant

## 2022-12-17 ENCOUNTER — Encounter (HOSPITAL_COMMUNITY): Payer: Self-pay

## 2022-12-17 DIAGNOSIS — J039 Acute tonsillitis, unspecified: Secondary | ICD-10-CM | POA: Diagnosis not present

## 2022-12-17 LAB — POCT MONO SCREEN (KUC): Mono, POC: NEGATIVE

## 2022-12-17 LAB — POCT RAPID STREP A (OFFICE): Rapid Strep A Screen: NEGATIVE

## 2022-12-17 MED ORDER — METHYLPREDNISOLONE ACETATE 80 MG/ML IJ SUSP
INTRAMUSCULAR | Status: AC
  Filled 2022-12-17: qty 1

## 2022-12-17 MED ORDER — METHYLPREDNISOLONE ACETATE 80 MG/ML IJ SUSP
60.0000 mg | Freq: Once | INTRAMUSCULAR | Status: AC
  Administered 2022-12-17: 60 mg via INTRAMUSCULAR

## 2022-12-17 MED ORDER — CLINDAMYCIN HCL 300 MG PO CAPS
300.0000 mg | ORAL_CAPSULE | Freq: Three times a day (TID) | ORAL | 0 refills | Status: AC
Start: 2022-12-17 — End: ?

## 2022-12-17 MED ORDER — DEXAMETHASONE SODIUM PHOSPHATE 10 MG/ML IJ SOLN: 10.0000 mg | Freq: Once | INTRAMUSCULAR | Status: DC

## 2022-12-17 NOTE — Discharge Instructions (Signed)
We given injection of steroids to help with the pain and swelling.  Gargle with warm salt water and alternate Tylenol ibuprofen for pain.  Start clindamycin 3 times daily.  This can cause a severe upset stomach so take it with food.  If you develop any severe diarrhea stop the medication to be seen immediately.  If symptoms or not improving quickly please follow-up with ENT.  If anything worsens you have difficulty swallowing, swelling of your throat, shortness of breath, muffled voice, nausea/vomiting you need to go to the emergency room.

## 2022-12-17 NOTE — ED Triage Notes (Signed)
Patient reports that he has had a sore throat x 1 week.  Patient states that he has not taken any medications for his  symptoms.

## 2022-12-17 NOTE — ED Provider Notes (Signed)
[facility]    CSN: [id number] Arrival date & time: 12/17/22  1042      History   Chief Complaint Chief Complaint  Patient presents with   Sore Throat    HPI Darrell Bonilla is a 37 y.o. male.   Patient presents today with a 1 week history of sore throat.  He does report some mild congestion and cough symptoms but reports that the sore throat is his primary symptoms.  Reports that pain is rated 8/9 on a 0-10 pain scale, localized to his posterior oropharynx but worse on the right side, described as sharp, worse with swallowing.  He has been able to eat and drink despite symptoms but it is painful to his had a decrease in oral intake.  Denies any muffled voice, dysphagia.  Denies any known sick contacts.  He has tried DayQuil with minimal improvement of symptoms.  He has not tried additional over-the-counter medications.  Denies any recent antibiotics or steroids.    Past Medical History:  Diagnosis Date   Anxiety    Depression    Sleep apnea     Patient Active Problem List   Diagnosis Date Noted   Closed dislocation of right hip (HCC) 09/29/2022    Past Surgical History:  Procedure Laterality Date   HIP CLOSED REDUCTION Right 09/29/2022   Procedure: CLOSED REDUCTION HIP;  Surgeon: Yolonda Kida, MD;  Location: [facility] OR;  Service: Orthopedics;  Laterality: Right;   HIP SURGERY Right    reconstruction   NOSE SURGERY         Home Medications    Prior to Admission medications   Medication Sig Start Date End Date Taking? Authorizing Provider  clindamycin (CLEOCIN) 300 MG capsule Take 1 capsule (300 mg total) by mouth 3 (three) times daily. 12/17/22  Yes Dora Simeone K, PA-C  cholecalciferol (VITAMIN D) 1000 UNITS tablet Take 1,000 Units by mouth daily.    [provider]  methocarbamol (ROBAXIN) 500 MG tablet Take 1 tablet (500 mg total) by mouth every 6 (six) hours as needed for muscle spasms. 10/01/22   Dion Saucier D, PA  Multiple Vitamin  (MULTIVITAMIN WITH MINERALS) TABS Take 1 tablet by mouth daily.    [provider]  Vilazodone HCl 20 MG TABS Take 20 mg by mouth daily.    [provider]    Family History Family History  Family history unknown: Yes    Social History Social History   Tobacco Use   Smoking status: Former    Types: Pipe   Smokeless tobacco: Former  Building services engineer status: Never Used  Substance Use Topics   Alcohol use: Not Currently   Drug use: No     Allergies   Ceclor [cefaclor] and Nuvigil [armodafinil]   Review of Systems Review of Systems  Constitutional:  Positive for activity change and fever. Negative for appetite change and fatigue.  HENT:  Positive for congestion, sore throat and trouble swallowing. Negative for sinus pressure, sneezing and voice change.   Respiratory:  Positive for cough. Negative for shortness of breath.   Cardiovascular:  Negative for chest pain.  Gastrointestinal:  Negative for abdominal pain, diarrhea, nausea and vomiting.     Physical Exam Triage Vital Signs ED Triage Vitals  Encounter Vitals Group     BP 12/17/22 1214 131/87     Systolic BP Percentile --      Diastolic BP Percentile --      Pulse Rate 12/17/22 1214 (!)  105     Resp 12/17/22 1214 16     Temp 12/17/22 1214 99 F (37.2 C)     Temp Source 12/17/22 1214 Oral     SpO2 12/17/22 1214 94 %     Weight --      Height --      Head Circumference --      Peak Flow --      Pain Score 12/17/22 1215 10     Pain Loc --      Pain Education --      Exclude from Growth Chart --    No data found.  Updated Vital Signs BP 131/87 (BP Location: Right Arm)   Pulse (!) 105   Temp 99 F (37.2 C) (Oral)   Resp 16   SpO2 94%   Visual Acuity Right Eye Distance:   Left Eye Distance:   Bilateral Distance:    Right Eye Near:   Left Eye Near:    Bilateral Near:     Physical Exam Vitals reviewed.  Constitutional:      General: He is awake.     Appearance: Normal  appearance. He is well-developed. He is not ill-appearing.     Comments: Very pleasant male appears stated age in no acute distress sitting comfortably in exam room  HENT:     Head: Normocephalic and atraumatic.     Right Ear: Tympanic membrane, ear canal and external ear normal. Tympanic membrane is not erythematous or bulging.     Left Ear: Tympanic membrane, ear canal and external ear normal. Tympanic membrane is not erythematous or bulging.     Nose: Nose normal.     Mouth/Throat:     Pharynx: Uvula midline. Posterior oropharyngeal erythema present. No oropharyngeal exudate.     Tonsils: Tonsillar exudate present. No tonsillar abscesses. 3+ on the right. 3+ on the left.  Cardiovascular:     Rate and Rhythm: Normal rate and regular rhythm.     Heart sounds: Normal heart sounds, S1 normal and S2 normal. No murmur heard. Pulmonary:     Effort: Pulmonary effort is normal. No accessory muscle usage or respiratory distress.     Breath sounds: Normal breath sounds. No stridor. No wheezing, rhonchi or rales.     Comments: Clear to auscultation bilaterally Abdominal:     Palpations: Abdomen is soft.     Tenderness: There is no abdominal tenderness.  Lymphadenopathy:     Head:     Right side of head: No submental, submandibular or tonsillar adenopathy.     Left side of head: No submental, submandibular or tonsillar adenopathy.     Cervical: No cervical adenopathy.  Neurological:     Mental Status: He is alert.  Psychiatric:        Behavior: Behavior is cooperative.      UC Treatments / Results  Labs (all labs ordered are listed, but only abnormal results are displayed) Labs Reviewed  POCT RAPID STREP A (OFFICE)  POCT MONO SCREEN ([facility])    EKG   Radiology No results found.  Procedures Procedures (including critical care time)  Medications Ordered in UC Medications  dexamethasone (DECADRON) injection 10 mg (has no administration in time range)    Initial Impression /  Assessment and Plan / UC Course  I have reviewed the triage vital signs and the nursing notes.  Pertinent labs & imaging results that were available during my care of the patient were reviewed by me and considered in my medical  decision making (see chart for details).     Patient is mildly tachycardic but otherwise well-appearing, afebrile, nontoxic, nontachycardic.  Recommended that he monitor his heart rate and if this remains elevated after he is feeling better from acute illness he should be reevaluated.  Strep testing was obtained and was negative.  Mono testing was also negative.  Concern for bacterial tonsillitis given clinical presentation.  He reports having a rash which she describes as similar to hives with Ceclor so we will use clindamycin.  Discussed that he should take this with food and that it can upset his stomach.  If he develops any severe diarrhea he is to stop the medication to be seen immediately.  He was given a dose of Decadron in clinic today to help with the swelling of his tonsils.  He can alternate over-the-counter analgesics for pain relief as well as gargle with warm salt water.  Discussed that if his symptoms or not improving quickly with treatment regimen he should follow-up with ENT and was given contact information for local provider.  Discussed at length that if he has any worsening or changing symptoms including dysphagia, muffled voice, shortness of breath, high fever, nausea/vomiting interfere with oral intake he needs to be seen emergently.  Strict return precautions given.  Patient declined work excuse note.  Final Clinical Impressions(s) / UC Diagnoses   Final diagnoses:  Acute tonsillitis, unspecified etiology     Discharge Instructions      We given injection of steroids to help with the pain and swelling.  Gargle with warm salt water and alternate Tylenol ibuprofen for pain.  Start clindamycin 3 times daily.  This can cause a severe upset stomach so take  it with food.  If you develop any severe diarrhea stop the medication to be seen immediately.  If symptoms or not improving quickly please follow-up with ENT.  If anything worsens you have difficulty swallowing, swelling of your throat, shortness of breath, muffled voice, nausea/vomiting you need to go to the emergency room.     ED Prescriptions     Medication Sig Dispense Auth. Provider   clindamycin (CLEOCIN) 300 MG capsule Take 1 capsule (300 mg total) by mouth 3 (three) times daily. 21 capsule Kaidence Callaway K, PA-C      PDMP not reviewed this encounter.   Jeani Hawking, PA-C 12/17/22 1303

## 2023-01-04 ENCOUNTER — Emergency Department (HOSPITAL_COMMUNITY)
Admission: EM | Admit: 2023-01-04 | Discharge: 2023-01-04 | Disposition: A | Discharge status: Home / Self Care | Attending: Emergency Medicine | Admitting: Emergency Medicine

## 2023-01-04 ENCOUNTER — Emergency Department (HOSPITAL_COMMUNITY)

## 2023-01-04 ENCOUNTER — Encounter (HOSPITAL_COMMUNITY): Payer: Self-pay

## 2023-01-04 ENCOUNTER — Other Ambulatory Visit: Payer: Self-pay

## 2023-01-04 DIAGNOSIS — M109 Gout, unspecified: Secondary | ICD-10-CM

## 2023-01-04 DIAGNOSIS — M79675 Pain in left toe(s): Secondary | ICD-10-CM | POA: Diagnosis present

## 2023-01-04 MED ORDER — HYDROCODONE-ACETAMINOPHEN 5-325 MG PO TABS
1.0000 | ORAL_TABLET | Freq: Four times a day (QID) | ORAL | 0 refills | Status: DC | PRN
Start: 2023-01-04 — End: 2023-01-04

## 2023-01-04 MED ORDER — ACETAMINOPHEN 500 MG PO TABS
1000.0000 mg | ORAL_TABLET | Freq: Once | ORAL | Status: AC
  Administered 2023-01-04: 1000 mg via ORAL
  Filled 2023-01-04: qty 2

## 2023-01-04 MED ORDER — HYDROCODONE-ACETAMINOPHEN 5-325 MG PO TABS
1.0000 | ORAL_TABLET | Freq: Four times a day (QID) | ORAL | 0 refills | Status: AC | PRN
Start: 2023-01-04 — End: ?

## 2023-01-04 MED ORDER — PREDNISONE 20 MG PO TABS: 40.0000 mg | ORAL_TABLET | Freq: Every day | ORAL | 0 refills | Status: DC

## 2023-01-04 MED ORDER — PREDNISONE 20 MG PO TABS: 40.0000 mg | ORAL_TABLET | Freq: Every day | ORAL | 0 refills | Status: AC

## 2023-01-04 NOTE — Discharge Instructions (Addendum)
You were seen in the emergency room today for gout.  I would recommend following low purine food diet touch discharge paper.  Have sent pain medication to your pharmacy, please take as needed.  I would alternate Tylenol and ibuprofen for pain, take Norco only if you have breakthrough pain.  I have sent steroids to your pharmacy, please take as prescribed.  I recommend following up with primary care in 2 to 3 days to ensure resolution of symptoms.  Return to the emergency room with any new or worsening symptoms.

## 2023-01-04 NOTE — ED Provider Notes (Addendum)
[place] EMERGENCY DEPARTMENT AT [facility] Provider Note   CSN: [id number] Arrival date & time: 01/04/23  1831     History  Chief Complaint  Patient presents with   Toe Pain    Darrell Bonilla is a 37 y.o. male with past medical history of anxiety, depression presenting to the emergency room for left-sided great toe pain that has been ongoing for 5 days.  Patient reports that started having pain on Monday that has gradually worsened and become more swelling and red.  Patient says he has had symptoms like this in the past.  Patient also reports following with podiatry for chronic foot pain.  Patient originally thought his pain was from trying new boots, after he stopped wearing boots pain continued.  Patient has taken ibuprofen and meloxicam today.  Symptoms have not improved. Denies fevers, chills, abdominal pain, SOB, CP.    Toe Pain       Home Medications Prior to Admission medications   Medication Sig Start Date End Date Taking? Authorizing Provider  cholecalciferol (VITAMIN D) 1000 UNITS tablet Take 1,000 Units by mouth daily.    [provider]  clindamycin (CLEOCIN) 300 MG capsule Take 1 capsule (300 mg total) by mouth 3 (three) times daily. 12/17/22   Raspet, Noberto Retort, PA-C  methocarbamol (ROBAXIN) 500 MG tablet Take 1 tablet (500 mg total) by mouth every 6 (six) hours as needed for muscle spasms. 10/01/22   Dion Saucier D, PA  Multiple Vitamin (MULTIVITAMIN WITH MINERALS) TABS Take 1 tablet by mouth daily.    [provider]  Vilazodone HCl 20 MG TABS Take 20 mg by mouth daily.    [provider]      Allergies    Ceclor [cefaclor] and Nuvigil [armodafinil]    Review of Systems   Review of Systems  Musculoskeletal:        Left great toe pain    Physical Exam Updated Vital Signs BP (!) 122/93 (BP Location: Right Arm)   Pulse (!) 112   Temp 98.9 F (37.2 C) (Oral)   Resp 16   Ht 5\' 7"  (1.702 m)   Wt 120.2 kg   SpO2 95%    BMI 41.50 kg/m  Physical Exam Vitals and nursing note reviewed.  Constitutional:      General: He is not in acute distress.    Appearance: He is not toxic-appearing.  HENT:     Head: Normocephalic and atraumatic.  Eyes:     General: No scleral icterus.    Conjunctiva/sclera: Conjunctivae normal.  Cardiovascular:     Rate and Rhythm: Normal rate and regular rhythm.     Pulses: Normal pulses.     Heart sounds: Normal heart sounds.  Pulmonary:     Effort: Pulmonary effort is normal. No respiratory distress.     Breath sounds: Normal breath sounds.  Abdominal:     General: Abdomen is flat. Bowel sounds are normal.     Palpations: Abdomen is soft.     Tenderness: There is no abdominal tenderness.  Musculoskeletal:     Comments: Left great toe, swelling, erythema and pain over metatarsal phalangeal joint. Severe pain with light palpation. Patient able to wiggle toes, able to weight-bear however weightbearing increases pain.  Strong dorsal pedal pulse bilaterally.  Skin:    General: Skin is warm and dry.     Findings: No lesion.  Neurological:     General: No focal deficit present.     Mental Status: He  is alert and oriented to person, place, and time. Mental status is at baseline.     ED Results / Procedures / Treatments   Labs (all labs ordered are listed, but only abnormal results are displayed) Labs Reviewed - No data to display  EKG None  Radiology No results found.  Procedures Procedures    Medications Ordered in ED Medications - No data to display  ED Course/ Medical Decision Making/ A&P                                 Medical Decision Making Amount and/or Complexity of Data Reviewed Radiology: ordered.  Risk OTC drugs. Prescription drug management.   This patient presents to the ED for concern of left great toe pain, this involves an extensive number of treatment options, and is a complaint that carries with it a high risk of complications and  morbidity.  The differential diagnosis includes note, pseudogout, arthritis, cellulitis, dislocation, fracture, chronic foot pain  Imaging Studies ordered:  I ordered imaging studies including left foot x-ray I independently visualized and interpreted imaging which showed negative for fracture or dislocation  I agree with the radiologist interpretation   Cardiac Monitoring: / EKG:  The patient was maintained on a cardiac monitor.  Vital stable throughout stay.   Consultations Obtained:  None    Problem List / ED Course / Critical interventions / Medication management  Reporting with 5 days of left great toe pain.  Patient has episodes like this in the past.  Patient has tried anti-inflammatories and has not improved his symptoms.  Patient does not have any open wounds or recent trauma or injury.  X-rays of left foot no acute fracture or dislocation.  Given stable vitals, no fever, reassuring presentation, I am not concerned about septic joint at this time.  Patient's exam is consistent with findings with gout.  Sent pain medication and steroids to pharmacy. I ordered medication including Tylenol   I have reviewed the patients home medicines and have made adjustments as needed   Plan  Sent steroids to pharmacy. Discussed importance of low purine diet.  Attached diet recommendations to discharge paper. Alternate Tylenol and ibuprofen for pain control. Patient stable for discharge, understands diagnosis and agrees with plan.  All questions answered at this time.  Return to emergency room with any new or worsening symptoms.  Follow-up with primary care in 2 to 3 days to ensure resolution of symptoms.           Final Clinical Impression(s) / ED Diagnoses Final diagnoses:  None    Rx / DC Orders ED Discharge Orders     None         Chenise Mulvihill, Horald Chestnut, PA-C 01/04/23 2205    Smitty Knudsen, PA-C 01/04/23 2212    Jacalyn Lefevre, MD 01/04/23 2330

## 2023-01-04 NOTE — ED Triage Notes (Addendum)
Pt c/o left big toe painx5d. Pt has 2+ swelling of left big toe swelling and swelling on posterior of foot near toe. Pt denies injury. Pt states bought new boots last week and thought it was that, but pain not going away and it started swelling
